# Patient Record
Sex: Female | Born: 1965
Health system: Southern US, Community
[De-identification: ages and names within clinical notes are randomized; demographics above are authoritative.]

## PROBLEM LIST (undated history)

## (undated) DIAGNOSIS — D649 Anemia, unspecified: Secondary | ICD-10-CM

## (undated) DIAGNOSIS — K219 Gastro-esophageal reflux disease without esophagitis: Secondary | ICD-10-CM

## (undated) DIAGNOSIS — R928 Other abnormal and inconclusive findings on diagnostic imaging of breast: Secondary | ICD-10-CM

## (undated) DIAGNOSIS — F419 Anxiety disorder, unspecified: Secondary | ICD-10-CM

## (undated) DIAGNOSIS — E663 Overweight: Secondary | ICD-10-CM

## (undated) DIAGNOSIS — J381 Polyp of vocal cord and larynx: Secondary | ICD-10-CM

## (undated) DIAGNOSIS — M542 Cervicalgia: Secondary | ICD-10-CM

## (undated) DIAGNOSIS — Z8489 Family history of other specified conditions: Secondary | ICD-10-CM

## (undated) DIAGNOSIS — R87619 Unspecified abnormal cytological findings in specimens from cervix uteri: Secondary | ICD-10-CM

## (undated) DIAGNOSIS — IMO0002 Reserved for concepts with insufficient information to code with codable children: Secondary | ICD-10-CM

## (undated) HISTORY — DX: Reserved for concepts with insufficient information to code with codable children: IMO0002

## (undated) HISTORY — DX: Overweight: E66.3

## (undated) HISTORY — DX: Unspecified abnormal cytological findings in specimens from cervix uteri: R87.619

## (undated) HISTORY — DX: Anemia, unspecified: D64.9

## (undated) HISTORY — DX: Polyp of vocal cord and larynx: J38.1

## (undated) HISTORY — DX: Anxiety disorder, unspecified: F41.9

## (undated) HISTORY — DX: Other abnormal and inconclusive findings on diagnostic imaging of breast: R92.8

## (undated) HISTORY — DX: Cervicalgia: M54.2

## (undated) HISTORY — PX: COLONOSCOPY: SHX174

## (undated) HISTORY — PX: ABDOMINAL HYSTERECTOMY: SHX81

## (undated) HISTORY — DX: Gastro-esophageal reflux disease without esophagitis: K21.9

---

## 1997-12-22 ENCOUNTER — Encounter: Admission: RE | Admit: 1997-12-22 | Discharge: 1998-03-22 | Payer: Self-pay

## 1999-03-17 ENCOUNTER — Inpatient Hospital Stay (HOSPITAL_COMMUNITY): Admission: AD | Admit: 1999-03-17 | Discharge: 1999-03-20 | Payer: Self-pay | Admitting: Obstetrics and Gynecology

## 1999-03-21 ENCOUNTER — Encounter (HOSPITAL_COMMUNITY): Admission: RE | Admit: 1999-03-21 | Discharge: 1999-06-19 | Payer: Self-pay | Admitting: Obstetrics and Gynecology

## 1999-05-05 ENCOUNTER — Other Ambulatory Visit: Admission: RE | Admit: 1999-05-05 | Discharge: 1999-05-05 | Payer: Self-pay | Admitting: Obstetrics and Gynecology

## 2000-05-21 ENCOUNTER — Other Ambulatory Visit: Admission: RE | Admit: 2000-05-21 | Discharge: 2000-05-21 | Payer: Self-pay | Admitting: Obstetrics and Gynecology

## 2000-11-12 ENCOUNTER — Encounter: Admission: RE | Admit: 2000-11-12 | Discharge: 2000-11-12 | Payer: Self-pay | Admitting: Infectious Diseases

## 2000-11-18 ENCOUNTER — Encounter: Payer: Self-pay | Admitting: Infectious Diseases

## 2000-11-18 ENCOUNTER — Ambulatory Visit (HOSPITAL_COMMUNITY): Admission: RE | Admit: 2000-11-18 | Discharge: 2000-11-18 | Payer: Self-pay | Admitting: Infectious Diseases

## 2000-11-20 ENCOUNTER — Encounter: Admission: RE | Admit: 2000-11-20 | Discharge: 2000-11-20 | Payer: Self-pay | Admitting: Infectious Diseases

## 2001-01-17 ENCOUNTER — Encounter: Admission: RE | Admit: 2001-01-17 | Discharge: 2001-01-17 | Payer: Self-pay | Admitting: Infectious Diseases

## 2001-01-17 ENCOUNTER — Encounter: Payer: Self-pay | Admitting: Infectious Diseases

## 2001-01-17 ENCOUNTER — Ambulatory Visit (HOSPITAL_COMMUNITY): Admission: RE | Admit: 2001-01-17 | Discharge: 2001-01-17 | Payer: Self-pay | Admitting: Infectious Diseases

## 2001-04-17 ENCOUNTER — Encounter: Payer: Self-pay | Admitting: Infectious Diseases

## 2001-04-17 ENCOUNTER — Ambulatory Visit (HOSPITAL_COMMUNITY): Admission: RE | Admit: 2001-04-17 | Discharge: 2001-04-17 | Payer: Self-pay | Admitting: Infectious Diseases

## 2001-06-02 ENCOUNTER — Other Ambulatory Visit: Admission: RE | Admit: 2001-06-02 | Discharge: 2001-06-02 | Payer: Self-pay | Admitting: Obstetrics and Gynecology

## 2002-07-21 ENCOUNTER — Other Ambulatory Visit: Admission: RE | Admit: 2002-07-21 | Discharge: 2002-07-21 | Payer: Self-pay | Admitting: Obstetrics and Gynecology

## 2003-10-28 ENCOUNTER — Emergency Department (HOSPITAL_COMMUNITY): Admission: AD | Admit: 2003-10-28 | Discharge: 2003-10-28 | Payer: Self-pay | Admitting: Family Medicine

## 2003-11-08 ENCOUNTER — Encounter: Admission: RE | Admit: 2003-11-08 | Discharge: 2003-11-08 | Payer: Self-pay | Admitting: Family Medicine

## 2004-01-11 ENCOUNTER — Other Ambulatory Visit: Admission: RE | Admit: 2004-01-11 | Discharge: 2004-01-11 | Payer: Self-pay | Admitting: Obstetrics and Gynecology

## 2005-03-23 ENCOUNTER — Other Ambulatory Visit: Admission: RE | Admit: 2005-03-23 | Discharge: 2005-03-23 | Payer: Self-pay | Admitting: Obstetrics and Gynecology

## 2006-10-10 ENCOUNTER — Ambulatory Visit: Payer: Self-pay | Admitting: Vascular Surgery

## 2006-10-17 ENCOUNTER — Ambulatory Visit: Payer: Self-pay | Admitting: *Deleted

## 2006-10-21 ENCOUNTER — Ambulatory Visit: Payer: Self-pay | Admitting: *Deleted

## 2006-12-12 ENCOUNTER — Ambulatory Visit: Payer: Self-pay | Admitting: Vascular Surgery

## 2007-12-22 ENCOUNTER — Emergency Department (HOSPITAL_COMMUNITY): Admission: EM | Admit: 2007-12-22 | Discharge: 2007-12-22 | Payer: Self-pay | Admitting: Family Medicine

## 2008-07-01 ENCOUNTER — Encounter: Admission: RE | Admit: 2008-07-01 | Discharge: 2008-07-01 | Payer: Self-pay | Admitting: Obstetrics and Gynecology

## 2009-01-04 ENCOUNTER — Ambulatory Visit: Payer: Self-pay | Admitting: Psychology

## 2009-01-18 ENCOUNTER — Ambulatory Visit: Payer: Self-pay | Admitting: Psychology

## 2009-02-03 ENCOUNTER — Ambulatory Visit: Payer: Self-pay | Admitting: Psychology

## 2009-02-11 ENCOUNTER — Ambulatory Visit: Payer: Self-pay | Admitting: Psychology

## 2009-02-24 ENCOUNTER — Ambulatory Visit: Payer: Self-pay | Admitting: Psychology

## 2009-03-08 ENCOUNTER — Ambulatory Visit: Payer: Self-pay | Admitting: Psychology

## 2009-03-15 ENCOUNTER — Ambulatory Visit: Payer: Self-pay | Admitting: Psychology

## 2009-04-05 ENCOUNTER — Ambulatory Visit: Payer: Self-pay | Admitting: Psychology

## 2009-05-10 ENCOUNTER — Ambulatory Visit: Payer: Self-pay | Admitting: Psychology

## 2009-07-04 ENCOUNTER — Encounter: Admission: RE | Admit: 2009-07-04 | Discharge: 2009-07-04 | Payer: Self-pay | Admitting: Obstetrics and Gynecology

## 2009-07-11 ENCOUNTER — Ambulatory Visit: Payer: Self-pay | Admitting: Psychology

## 2009-07-26 ENCOUNTER — Ambulatory Visit: Payer: Self-pay | Admitting: Psychology

## 2009-08-05 ENCOUNTER — Ambulatory Visit: Payer: Self-pay | Admitting: Psychology

## 2009-08-12 ENCOUNTER — Ambulatory Visit: Payer: Self-pay | Admitting: Psychology

## 2009-08-18 ENCOUNTER — Ambulatory Visit: Payer: Self-pay | Admitting: Psychology

## 2009-08-23 ENCOUNTER — Ambulatory Visit: Payer: Self-pay | Admitting: Psychology

## 2009-12-06 ENCOUNTER — Ambulatory Visit: Payer: Self-pay | Admitting: Psychology

## 2010-09-17 ENCOUNTER — Encounter: Payer: Self-pay | Admitting: Obstetrics and Gynecology

## 2010-09-18 ENCOUNTER — Encounter
Admission: RE | Admit: 2010-09-18 | Discharge: 2010-09-18 | Payer: Self-pay | Source: Home / Self Care | Attending: Obstetrics and Gynecology | Admitting: Obstetrics and Gynecology

## 2010-09-18 ENCOUNTER — Encounter: Payer: Self-pay | Admitting: Obstetrics and Gynecology

## 2010-11-11 ENCOUNTER — Emergency Department (HOSPITAL_BASED_OUTPATIENT_CLINIC_OR_DEPARTMENT_OTHER)
Admission: EM | Admit: 2010-11-11 | Discharge: 2010-11-11 | Disposition: A | Discharge status: Home / Self Care | Payer: 59 | Attending: Emergency Medicine | Admitting: Emergency Medicine

## 2010-11-11 ENCOUNTER — Emergency Department (INDEPENDENT_AMBULATORY_CARE_PROVIDER_SITE_OTHER): Payer: 59

## 2010-11-11 DIAGNOSIS — R0789 Other chest pain: Secondary | ICD-10-CM | POA: Insufficient documentation

## 2010-11-11 DIAGNOSIS — R079 Chest pain, unspecified: Secondary | ICD-10-CM

## 2010-11-11 DIAGNOSIS — F411 Generalized anxiety disorder: Secondary | ICD-10-CM | POA: Insufficient documentation

## 2010-11-11 LAB — BASIC METABOLIC PANEL
BUN: 12 mg/dL (ref 6–23)
CO2: 28 mEq/L (ref 19–32)
Calcium: 9.3 mg/dL (ref 8.4–10.5)
Sodium: 144 mEq/L (ref 135–145)

## 2010-11-11 LAB — DIFFERENTIAL
Basophils Relative: 1 % (ref 0–1)
Eosinophils Absolute: 0.1 10*3/uL (ref 0.0–0.7)
Eosinophils Relative: 1 % (ref 0–5)
Lymphocytes Relative: 14 % (ref 12–46)
Neutro Abs: 6.3 10*3/uL (ref 1.7–7.7)

## 2010-11-11 LAB — POCT CARDIAC MARKERS
CKMB, poc: <1 ng/mL — ABNORMAL LOW (ref 1.0–8.0)
Troponin i, poc: <0.05 ng/mL (ref 0.00–0.09)

## 2010-11-11 LAB — CBC
MCH: 30.1 pg (ref 26.0–34.0)
MCHC: 34.1 g/dL (ref 30.0–36.0)
RBC: 4.05 MIL/uL (ref 3.87–5.11)

## 2011-11-06 ENCOUNTER — Ambulatory Visit (INDEPENDENT_AMBULATORY_CARE_PROVIDER_SITE_OTHER): Payer: 59 | Admitting: Internal Medicine

## 2011-11-06 ENCOUNTER — Encounter: Payer: Self-pay | Admitting: Internal Medicine

## 2011-11-06 VITALS — BP 118/76 | HR 68 | Temp 99.2°F | Ht 67.0 in | Wt 169.5 lb

## 2011-11-06 DIAGNOSIS — R6889 Other general symptoms and signs: Secondary | ICD-10-CM

## 2011-11-06 DIAGNOSIS — D649 Anemia, unspecified: Secondary | ICD-10-CM | POA: Insufficient documentation

## 2011-11-06 DIAGNOSIS — Z8669 Personal history of other diseases of the nervous system and sense organs: Secondary | ICD-10-CM | POA: Insufficient documentation

## 2011-11-06 DIAGNOSIS — F419 Anxiety disorder, unspecified: Secondary | ICD-10-CM | POA: Insufficient documentation

## 2011-11-06 DIAGNOSIS — IMO0002 Reserved for concepts with insufficient information to code with codable children: Secondary | ICD-10-CM

## 2011-11-06 DIAGNOSIS — M542 Cervicalgia: Secondary | ICD-10-CM | POA: Insufficient documentation

## 2011-11-06 DIAGNOSIS — R87619 Unspecified abnormal cytological findings in specimens from cervix uteri: Secondary | ICD-10-CM | POA: Insufficient documentation

## 2011-11-06 DIAGNOSIS — Z139 Encounter for screening, unspecified: Secondary | ICD-10-CM

## 2011-11-06 DIAGNOSIS — F411 Generalized anxiety disorder: Secondary | ICD-10-CM | POA: Insufficient documentation

## 2011-11-06 DIAGNOSIS — Z0000 Encounter for general adult medical examination without abnormal findings: Secondary | ICD-10-CM

## 2011-11-06 HISTORY — DX: Personal history of other diseases of the nervous system and sense organs: Z86.69

## 2011-11-06 LAB — COMPREHENSIVE METABOLIC PANEL
AST: 26 U/L (ref 0–37)
Albumin: 4.4 g/dL (ref 3.5–5.2)
BUN: 17 mg/dL (ref 6–23)
CO2: 26 mEq/L (ref 19–32)
Glucose, Bld: 80 mg/dL (ref 70–99)
Potassium: 4.5 mEq/L (ref 3.5–5.3)
Sodium: 139 mEq/L (ref 135–145)

## 2011-11-06 LAB — LIPID PANEL
Cholesterol: 204 mg/dL — ABNORMAL HIGH (ref 0–200)
HDL: 68 mg/dL (ref >39)
LDL Cholesterol: 118 mg/dL — ABNORMAL HIGH (ref 0–99)
Total CHOL/HDL Ratio: 3 Ratio
Triglycerides: 88 mg/dL (ref <150)

## 2011-11-06 LAB — POCT URINALYSIS DIP (MANUAL ENTRY)
Blood, UA: NEGATIVE
Glucose, UA: NEGATIVE
Nitrite, UA: NEGATIVE
Protein Ur, POC: NEGATIVE

## 2011-11-06 NOTE — Patient Instructions (Signed)
Keep appt with GYN  Labs wil be mailed to you

## 2011-11-06 NOTE — Progress Notes (Signed)
Subjective:    Patient ID: Karina Barnett, female    DOB: [birth year], 46 y.o.   MRN: [id number]  HPI Karina Barnett is a new pt here for CPE.  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 She is getting married this summer.  Former care at [employer].  PMH of Fe deficiency anemia, anxiety post divorce,  Remote abnormal pap S/P cryosurgery, and abnormal mammogram in 2010.  Karina Barnett also describes remote history of FUO with Bell's palsy felt related to bacterial pneumonia.  Fever resolved with antibiotics and she has no residual from Bell;s palsy  Overall doing well.  She has occasional painful calf pain at night.  No edema to LE  She has not used and anxiety med in quite some time and is looking forward to her upcoming seocnd marriage.   She is exercising with 21 minute workout and is losing weight.  She is watching her diet very well  Allergies  Allergen Reactions  . Flexeril (Cyclobenzaprine Hcl) Other (See Comments)    Causes severe joint pain  . Penicillins Rash   Past Medical History  Diagnosis Date  . Abnormal mammogram   . Abnormal Pap smear   . Cervical spine pain     C5-C6 compression  . Anemia   . Anxiety    History reviewed. No pertinent past surgical history. History   Social History  . Marital Status: Married    Spouse Name: N/A    Number of Children: N/A  . Years of Education: N/A   Occupational History  . Not on file.   Social History Main Topics  . Smoking status: Former Games developer  . Smokeless tobacco: Never Used  . Alcohol Use: Yes     social, occasional  . Drug Use: No  . Sexually Active: Yes    Birth Control/ Protection: IUD   Other Topics Concern  . Not on file   Social History Narrative  . No narrative on file   Family History  Problem Relation Age of Onset  . Hypertension Mother   . Irritable bowel syndrome Mother   . Arthritis Maternal Grandmother   . Hypertension Maternal Grandfather   . Heart disease Maternal Grandfather   . Alzheimer's disease Maternal  Grandmother   . Alzheimer's disease Paternal Grandmother    Patient Active Problem List  Diagnoses  . Anemia  . Anxiety  . Abnormal Pap smear  . Cervical spine pain  . H/O Bell's palsy   Current Outpatient Prescriptions on File Prior to Visit  Medication Sig Dispense Refill  . levonorgestrel (MIRENA) 20 MCG/24HR IUD 1 each by Intrauterine route once. 2009            Review of Systems See HPI    Objective:   Physical Exam Physical Exam  Nursing note and vitals reviewed.  Constitutional: She is oriented to person, place, and time. She appears well-developed and well-nourished.  HENT:  Head: Normocephalic and atraumatic.  Right Ear: Tympanic membrane and ear canal normal. No drainage. Tympanic membrane is not injected and not erythematous.  Left Ear: Tympanic membrane and ear canal normal. No drainage. Tympanic membrane is not injected and not erythematous.  Nose: Nose normal. Right sinus exhibits no maxillary sinus tenderness and no frontal sinus tenderness. Left sinus exhibits no maxillary sinus tenderness and no frontal sinus tenderness.  Mouth/Throat: Oropharynx is clear and moist. No oral lesions. No oropharyngeal exudate.  Eyes: Conjunctivae and EOM are normal. Pupils are equal, round, and reactive to light.  Neck: Normal  range of motion. Neck supple. No JVD present. Carotid bruit is not present. No mass and no thyromegaly present.  Cardiovascular: Normal rate, regular rhythm, S1 normal, S2 normal and intact distal pulses. Exam reveals no gallop and no friction rub.  No murmur heard.  Pulses:  Carotid pulses are 2+ on the right side, and 2+ on the left side.  Dorsalis pedis pulses are 2+ on the right side, and 2+ on the left side.  No carotid bruit. No LE edema  Pulmonary/Chest: Breath sounds normal. She has no wheezes. She has no rales. She exhibits no tenderness. Breasts no discrete masses no nipple discharge no axillary addenopathy bilaterally Abdominal: Soft. Bowel  sounds are normal. She exhibits no distension and no mass. There is no hepatosplenomegaly. There is no tenderness. There is no CVA tenderness.  Musculoskeletal: Normal range of motion.  No active synovitis to joints.  Lymphadenopathy:  She has no cervical adenopathy.  She has no axillary adenopathy.  Right: No inguinal and no supraclavicular adenopathy present.  Left: No inguinal and no supraclavicular adenopathy present.  Neurological: She is alert and oriented to person, place, and time. She has normal strength and normal reflexes. She displays no tremor. No cranial nerve deficit or sensory deficit. Coordination and gait normal.  Skin: Skin is warm and dry. No rash noted. No cyanosis. Nails show no clubbing.  Psychiatric: She has a normal mood and affect. Her speech is normal and behavior is normal. Cognition and memory are normal.           Assessment & Plan:  1)  Health Maintneannce   She is to check when she received TDAP from [facility].  See scanned sheet  She has pap scheduled with Dr. Burney Gauze in a few weeks 2)  Anemia   Will check today 3)  H/o abnomral mammogram   Most recent mamamogram no worrsome findings.  She is due in April for yearly screening 4)  H/O Bell's palsy no residual on exam. 5)  Calf discomfort.   Will moniter. Really does not fit with RLS

## 2011-11-07 LAB — CBC WITH DIFFERENTIAL/PLATELET
Basophils Absolute: 0 10*3/uL (ref 0.0–0.1)
Basophils Relative: 1 % (ref 0–1)
Eosinophils Relative: 2 % (ref 0–5)
HCT: 38.8 % (ref 36.0–46.0)
Lymphocytes Relative: 28 % (ref 12–46)
MCHC: 32 g/dL (ref 30.0–36.0)
Platelets: 263 10*3/uL (ref 150–400)
RBC: 4.27 MIL/uL (ref 3.87–5.11)
RDW: 12.9 % (ref 11.5–15.5)

## 2011-11-07 LAB — TSH: TSH: 1.083 u[IU]/mL (ref 0.350–4.500)

## 2011-11-07 LAB — POCT URINALYSIS DIPSTICK
Nitrite, UA: NEGATIVE
Spec Grav, UA: 1.02
Urobilinogen, UA: 0.2

## 2011-11-08 ENCOUNTER — Telehealth: Payer: Self-pay | Admitting: Emergency Medicine

## 2011-11-08 NOTE — Telephone Encounter (Signed)
Lab results mailed to pt's home address per DDS

## 2011-11-14 ENCOUNTER — Telehealth: Payer: Self-pay | Admitting: Emergency Medicine

## 2011-11-14 MED ORDER — AZITHROMYCIN 250 MG PO TABS: ORAL_TABLET | ORAL | Status: AC

## 2011-11-14 NOTE — Telephone Encounter (Signed)
Karina Barnett called this morning stating she continues to have head and sinus congestion.  She states symptoms started 10 days ago, she was seen 3/12 and was told if her symptoms did not improve to call back.  She is no longer running a fever, but has lots of head and nasal congestion, sore throat from drainage, slight cough, runny nose with yellowish mucous.  She states that she is taking OTC sinus medication and will continue that therapy if that is what DDS feels is best.  She just feels run down and wanted to see if DDS felt like she needed an antibiotic since it had been going on so long.  Requests any medication be sent to [facility]

## 2011-11-14 NOTE — Telephone Encounter (Signed)
Let Karina Barnett know that I sent over a Z-pak

## 2011-11-14 NOTE — Telephone Encounter (Signed)
Spoke with Drinda Butts, she is aware Zpak called in, will call if no improvement in symptoms after completion of antibiotics

## 2011-11-26 ENCOUNTER — Other Ambulatory Visit (HOSPITAL_COMMUNITY): Payer: Self-pay | Admitting: Obstetrics and Gynecology

## 2011-11-26 DIAGNOSIS — Z1231 Encounter for screening mammogram for malignant neoplasm of breast: Secondary | ICD-10-CM

## 2011-11-27 ENCOUNTER — Ambulatory Visit (HOSPITAL_COMMUNITY)
Admission: RE | Admit: 2011-11-27 | Discharge: 2011-11-27 | Disposition: A | Discharge status: Home / Self Care | Payer: 59 | Source: Ambulatory Visit | Attending: Obstetrics and Gynecology | Admitting: Obstetrics and Gynecology

## 2011-11-27 DIAGNOSIS — Z1231 Encounter for screening mammogram for malignant neoplasm of breast: Secondary | ICD-10-CM | POA: Insufficient documentation

## 2012-09-08 ENCOUNTER — Ambulatory Visit: Payer: 59 | Admitting: Internal Medicine

## 2012-09-08 DIAGNOSIS — Z1389 Encounter for screening for other disorder: Secondary | ICD-10-CM

## 2012-10-11 ENCOUNTER — Other Ambulatory Visit: Payer: Self-pay

## 2012-11-18 ENCOUNTER — Ambulatory Visit (INDEPENDENT_AMBULATORY_CARE_PROVIDER_SITE_OTHER): Payer: 59 | Admitting: Internal Medicine

## 2012-11-18 ENCOUNTER — Encounter: Payer: Self-pay | Admitting: Internal Medicine

## 2012-11-18 VITALS — BP 98/61 | HR 64 | Temp 97.3°F | Resp 16 | Ht 67.0 in | Wt 169.0 lb

## 2012-11-18 DIAGNOSIS — Z139 Encounter for screening, unspecified: Secondary | ICD-10-CM

## 2012-11-18 DIAGNOSIS — I499 Cardiac arrhythmia, unspecified: Secondary | ICD-10-CM

## 2012-11-18 DIAGNOSIS — Z8669 Personal history of other diseases of the nervous system and sense organs: Secondary | ICD-10-CM

## 2012-11-18 DIAGNOSIS — H109 Unspecified conjunctivitis: Secondary | ICD-10-CM

## 2012-11-18 DIAGNOSIS — E785 Hyperlipidemia, unspecified: Secondary | ICD-10-CM | POA: Insufficient documentation

## 2012-11-18 DIAGNOSIS — I495 Sick sinus syndrome: Secondary | ICD-10-CM

## 2012-11-18 DIAGNOSIS — Z0000 Encounter for general adult medical examination without abnormal findings: Secondary | ICD-10-CM

## 2012-11-18 HISTORY — DX: Cardiac arrhythmia, unspecified: I49.9

## 2012-11-18 HISTORY — DX: Unspecified conjunctivitis: H10.9

## 2012-11-18 LAB — COMPREHENSIVE METABOLIC PANEL
ALT: 15 U/L (ref 0–35)
Sodium: 138 mEq/L (ref 135–145)
Total Bilirubin: 0.6 mg/dL (ref 0.3–1.2)

## 2012-11-18 LAB — CBC WITH DIFFERENTIAL/PLATELET
Basophils Relative: 1 % (ref 0–1)
Eosinophils Absolute: 0.1 10*3/uL (ref 0.0–0.7)
Eosinophils Relative: 2 % (ref 0–5)
HCT: 38.8 % (ref 36.0–46.0)
Hemoglobin: 13.1 g/dL (ref 12.0–15.0)
Lymphocytes Relative: 27 % (ref 12–46)
MCHC: 33.8 g/dL (ref 30.0–36.0)
MCV: 86.6 fL (ref 78.0–100.0)
Neutro Abs: 3 10*3/uL (ref 1.7–7.7)
Platelets: 281 10*3/uL (ref 150–400)
RBC: 4.48 MIL/uL (ref 3.87–5.11)
RDW: 13.4 % (ref 11.5–15.5)
WBC: 4.8 10*3/uL (ref 4.0–10.5)

## 2012-11-18 LAB — POCT URINALYSIS DIPSTICK
Glucose, UA: NEGATIVE
Ketones, UA: NEGATIVE
Urobilinogen, UA: NEGATIVE

## 2012-11-18 LAB — LIPID PANEL
Cholesterol: 222 mg/dL — ABNORMAL HIGH (ref 0–200)
HDL: 74 mg/dL (ref >39)

## 2012-11-18 MED ORDER — CIPROFLOXACIN HCL 0.3 % OP SOLN: OPHTHALMIC | Status: DC

## 2012-11-18 MED ORDER — ACYCLOVIR 200 MG PO CAPS: 200.0000 mg | ORAL_CAPSULE | Freq: Two times a day (BID) | ORAL | Status: DC

## 2012-11-18 MED ORDER — ALPRAZOLAM 0.25 MG PO TABS: 0.2500 mg | ORAL_TABLET | Freq: Every evening | ORAL | Status: DC | PRN
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
## 2012-11-18 NOTE — Patient Instructions (Addendum)
Be sure to make appt with eye MD  Use eye drops to Right eye as directed  Check about your Tetanus vaccine  .  Call if you need a 10 year booster

## 2012-11-19 LAB — VITAMIN D 25 HYDROXY (VIT D DEFICIENCY, FRACTURES): Vit D, 25-Hydroxy: 23 ng/mL — ABNORMAL LOW (ref 30–89)

## 2012-11-20 ENCOUNTER — Encounter: Payer: Self-pay | Admitting: Internal Medicine

## 2012-11-20 ENCOUNTER — Encounter: Payer: Self-pay | Admitting: *Deleted

## 2012-12-25 ENCOUNTER — Ambulatory Visit: Payer: 59

## 2013-01-01 ENCOUNTER — Ambulatory Visit
Admission: RE | Admit: 2013-01-01 | Discharge: 2013-01-01 | Disposition: A | Discharge status: Home / Self Care | Payer: 59 | Source: Ambulatory Visit | Attending: Internal Medicine | Admitting: Internal Medicine

## 2013-01-01 DIAGNOSIS — Z139 Encounter for screening, unspecified: Secondary | ICD-10-CM

## 2013-01-08 ENCOUNTER — Telehealth: Payer: Self-pay | Admitting: *Deleted

## 2013-01-08 NOTE — Telephone Encounter (Signed)
Returned pt call regarding appt for daughters LVM awaiting return call

## 2013-05-14 ENCOUNTER — Encounter: Payer: Self-pay | Admitting: *Deleted

## 2013-05-14 ENCOUNTER — Encounter: Payer: 59 | Attending: Internal Medicine | Admitting: *Deleted

## 2013-05-14 VITALS — Ht 65.5 in | Wt 171.5 lb

## 2013-05-14 DIAGNOSIS — Z713 Dietary counseling and surveillance: Secondary | ICD-10-CM | POA: Insufficient documentation

## 2013-05-14 DIAGNOSIS — E663 Overweight: Secondary | ICD-10-CM | POA: Insufficient documentation

## 2013-05-18 ENCOUNTER — Encounter: Payer: Self-pay | Admitting: *Deleted

## 2013-05-18 NOTE — Progress Notes (Signed)
  Medical Nutrition Therapy:  Appt start time: 1145   End time:  1245.  Assessment:  Overweight/Nutrition Counseling.   MEDICATIONS: See medication list   DIETARY INTAKE:  Usual eating pattern includes 3 meals and 0-2 snacks per day.  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   Nutritional Diagnosis:  [place]-3.3 Overweight related to history of poor food choices and lack of exercise as evidenced by BMI of 28.1 kg/m^2 and busy lifestyle.    Intervention:  Nutrition education.  Monitoring/Evaluation:  Dietary intake, exercise, and body weight prn.

## 2013-07-02 ENCOUNTER — Other Ambulatory Visit: Payer: Self-pay

## 2013-08-17 ENCOUNTER — Ambulatory Visit (INDEPENDENT_AMBULATORY_CARE_PROVIDER_SITE_OTHER): Payer: 59 | Admitting: Internal Medicine

## 2013-08-17 ENCOUNTER — Encounter: Payer: Self-pay | Admitting: Internal Medicine

## 2013-08-17 VITALS — BP 102/63 | HR 71 | Temp 98.2°F | Resp 18

## 2013-08-17 DIAGNOSIS — F439 Reaction to severe stress, unspecified: Secondary | ICD-10-CM

## 2013-08-17 DIAGNOSIS — Z733 Stress, not elsewhere classified: Secondary | ICD-10-CM

## 2013-08-17 DIAGNOSIS — J209 Acute bronchitis, unspecified: Secondary | ICD-10-CM

## 2013-08-17 DIAGNOSIS — H109 Unspecified conjunctivitis: Secondary | ICD-10-CM

## 2013-08-17 DIAGNOSIS — R05 Cough: Secondary | ICD-10-CM

## 2013-08-17 DIAGNOSIS — R059 Cough, unspecified: Secondary | ICD-10-CM

## 2013-08-17 MED ORDER — OLOPATADINE HCL 0.2 % OP SOLN: OPHTHALMIC | Status: DC

## 2013-08-17 MED ORDER — HYDROCOD POLST-CHLORPHEN POLST 10-8 MG/5ML PO LQCR: 5.0000 mL | Freq: Two times a day (BID) | ORAL | Status: DC | PRN

## 2013-08-17 MED ORDER — AZITHROMYCIN 250 MG PO TABS: ORAL_TABLET | ORAL | Status: DC

## 2013-08-17 MED ORDER — CIPROFLOXACIN HCL 0.3 % OP OINT: TOPICAL_OINTMENT | OPHTHALMIC | Status: DC
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
## 2013-10-21 ENCOUNTER — Ambulatory Visit (HOSPITAL_COMMUNITY)
Admission: RE | Admit: 2013-10-21 | Discharge: 2013-10-21 | Disposition: A | Discharge status: Home / Self Care | Payer: 59 | Source: Ambulatory Visit | Attending: Internal Medicine | Admitting: Internal Medicine

## 2013-10-21 DIAGNOSIS — I499 Cardiac arrhythmia, unspecified: Secondary | ICD-10-CM | POA: Insufficient documentation

## 2013-10-21 LAB — COMPREHENSIVE METABOLIC PANEL
ALT: 21 U/L (ref 0–35)
AST: 22 U/L (ref 0–37)
Albumin: 4.2 g/dL (ref 3.5–5.2)
Alkaline Phosphatase: 76 U/L (ref 39–117)
BILIRUBIN TOTAL: 0.4 mg/dL (ref 0.3–1.2)
BUN: 26 mg/dL — AB (ref 6–23)
CO2: 27 mEq/L (ref 19–32)
Calcium: 10.3 mg/dL (ref 8.4–10.5)
Chloride: 101 mEq/L (ref 96–112)
Creatinine, Ser: 0.8 mg/dL (ref 0.50–1.10)
GFR calc Af Amer: >90 mL/min (ref >90)
GFR calc non Af Amer: 86 mL/min — ABNORMAL LOW (ref >90)
GLUCOSE: 103 mg/dL — AB (ref 70–99)
POTASSIUM: 4.7 meq/L (ref 3.7–5.3)
Sodium: 141 mEq/L (ref 137–147)
Total Protein: 7.6 g/dL (ref 6.0–8.3)

## 2013-10-21 LAB — TSH: TSH: 1.511 u[IU]/mL (ref 0.350–4.500)

## 2013-12-30 ENCOUNTER — Ambulatory Visit (INDEPENDENT_AMBULATORY_CARE_PROVIDER_SITE_OTHER): Payer: 59 | Admitting: Internal Medicine

## 2013-12-30 ENCOUNTER — Encounter: Payer: Self-pay | Admitting: Internal Medicine

## 2013-12-30 VITALS — BP 117/66 | HR 66 | Temp 98.3°F | Resp 18 | Wt 172.0 lb

## 2013-12-30 DIAGNOSIS — J029 Acute pharyngitis, unspecified: Secondary | ICD-10-CM

## 2013-12-30 DIAGNOSIS — H6690 Otitis media, unspecified, unspecified ear: Secondary | ICD-10-CM

## 2013-12-30 HISTORY — DX: Acute pharyngitis, unspecified: J02.9

## 2013-12-30 MED ORDER — AZITHROMYCIN 250 MG PO TABS: ORAL_TABLET | ORAL | Status: DC

## 2013-12-30 NOTE — Progress Notes (Signed)
Subjective:    Patient ID: Karina Barnett, female    DOB: July 01, 1966, 48 y.o.   MRN: [id number]  HPI Karina Barnett is here with acute visit  Several days of sore throat no fever no ear pain no cough  Allergies  Allergen Reactions  . Flexeril [Cyclobenzaprine Hcl] Other (See Comments)    Causes severe joint pain  . Penicillins Rash   Past Medical History  Diagnosis Date  . Abnormal mammogram   . Abnormal Pap smear   . Cervical spine pain     C5-C6 compression  . Anemia   . Anxiety   . Overweight (BMI 25.0-29.9)    History reviewed. No pertinent past surgical history. History   Social History  . Marital Status: Married    Spouse Name: N/A    Number of Children: N/A  . Years of Education: N/A   Occupational History  . Not on file.   Social History Main Topics  . Smoking status: Never Smoker   . Smokeless tobacco: Never Used  . Alcohol Use: Yes     Comment: social, occasional  . Drug Use: No  . Sexual Activity: Yes    Birth Control/ Protection: IUD   Other Topics Concern  . Not on file   Social History Narrative  . No narrative on file   Family History  Problem Relation Age of Onset  . Hypertension Mother   . Irritable bowel syndrome Mother   . Arthritis Maternal Grandmother   . Hypertension Maternal Grandfather   . Heart disease Maternal Grandfather   . Alzheimer's disease Maternal Grandmother   . Alzheimer's disease Paternal Grandmother    Patient Active Problem List   Diagnosis Date Noted  . Other and unspecified hyperlipidemia 11/18/2012  . Conjunctivitis unspecified 11/18/2012  . Arrhythmia, sinus node 11/18/2012  . H/O Bell's palsy 11/06/2011  . Anemia   . Anxiety   . Abnormal Pap smear   . Cervical spine pain    Current Outpatient Prescriptions on File Prior to Visit  Medication Sig Dispense Refill  . ALPRAZolam (XANAX) 0.25 MG tablet Take 1 tablet (0.25 mg total) by mouth at bedtime as needed.  30 tablet  1  . levonorgestrel (MIRENA) 20  MCG/24HR IUD 1 each by Intrauterine route once. 2009      . Multiple Vitamins-Calcium (ONE-A-DAY WOMENS PO) Take 1 tablet by mouth.      . Olopatadine HCl (PATADAY) 0.2 % SOLN Apply 2 gtts to eye when done with  Ciloxan  2.5 Bottle  0  . acyclovir (ZOVIRAX) 200 MG capsule Take 200 mg by mouth 2 (two) times daily as needed.      Marland Kitchen ibuprofen (ADVIL,MOTRIN) 200 MG tablet Take 400 mg by mouth every 6 (six) hours as needed.       No current facility-administered medications on file prior to visit.       Review of Systems See HPI    Objective:   Physical Exam Physical Exam  Constitutional: She is oriented to person, place, and time. She appears well-developed and well-nourished. She is cooperative.  HENT:  Head: Normocephalic and atraumatic.  Right Ear: A middle ear effusion is present.  Left Ear: A middle ear effusion is present.  Nose: Mucosal edema present.  Mouth/Throat: Oropharyngeal exudate and posterior oropharyngeal erythema present.  Serous effusion bilaterally  Eyes: Conjunctivae and EOM are normal. Pupils are equal, round, and reactive to light.  Neck: Neck supple. Carotid bruit is not present. No mass present.  Cardiovascular:  Regular rhythm, normal heart sounds, intact distal pulses and normal pulses. Exam reveals no gallop and no friction rub.  No murmur heard.  Pulmonary/Chest: Breath sounds normal. She has no wheezes. She has no rhonchi. She has no rales.  Lymphadenopathy:  She has cervical adenopathy.  Neurological: She is alert and oriented to person, place, and time.  Skin: Skin is warm and dry. No abrasion, no bruising, no ecchymosis and no rash noted. No cyanosis. Nails show no clubbing.  Psychiatric: She has a normal mood and affect. Her speech is normal and behavior is normal.                Assessment & Plan:  Pharyngitis:  Z-pack    Serous OM    See e if no tbette

## 2014-02-24 ENCOUNTER — Other Ambulatory Visit: Payer: Self-pay

## 2014-02-24 DIAGNOSIS — Z1231 Encounter for screening mammogram for malignant neoplasm of breast: Secondary | ICD-10-CM

## 2014-03-05 ENCOUNTER — Ambulatory Visit
Admission: RE | Admit: 2014-03-05 | Discharge: 2014-03-05 | Disposition: A | Discharge status: Home / Self Care | Payer: 59 | Source: Ambulatory Visit

## 2014-03-05 DIAGNOSIS — Z1231 Encounter for screening mammogram for malignant neoplasm of breast: Secondary | ICD-10-CM

## 2014-04-12 ENCOUNTER — Encounter: Payer: Self-pay | Admitting: Internal Medicine

## 2014-04-12 ENCOUNTER — Other Ambulatory Visit: Payer: Self-pay | Admitting: *Deleted

## 2014-04-12 MED ORDER — ALPRAZOLAM 0.25 MG PO TABS: 0.2500 mg | ORAL_TABLET | Freq: Every evening | ORAL | Status: DC | PRN

## 2014-04-12 NOTE — Telephone Encounter (Signed)
Verbal called in & pt informed.

## 2014-04-12 NOTE — Telephone Encounter (Signed)
need to renew my prescription for Xanax .25mg . If you need to speak with me please call my cell phone [phone]. [facility]. I am having anxiety about my youngest daughter who is 49 years old and we found a lump in her right breast. I am trying to deal with this stress but I may need some assistance. thanks Karina Barnett  This request was sent to me by [facility]

## 2014-04-28 ENCOUNTER — Ambulatory Visit (INDEPENDENT_AMBULATORY_CARE_PROVIDER_SITE_OTHER): Payer: 59 | Admitting: Internal Medicine

## 2014-04-28 ENCOUNTER — Encounter: Payer: Self-pay | Admitting: Internal Medicine

## 2014-04-28 VITALS — BP 100/69 | HR 65 | Temp 98.2°F | Resp 16 | Wt 173.0 lb

## 2014-04-28 DIAGNOSIS — S8991XA Unspecified injury of right lower leg, initial encounter: Secondary | ICD-10-CM

## 2014-04-28 DIAGNOSIS — M25569 Pain in unspecified knee: Secondary | ICD-10-CM

## 2014-04-28 DIAGNOSIS — J029 Acute pharyngitis, unspecified: Secondary | ICD-10-CM

## 2014-04-28 DIAGNOSIS — R059 Cough, unspecified: Secondary | ICD-10-CM

## 2014-04-28 DIAGNOSIS — R05 Cough: Secondary | ICD-10-CM

## 2014-04-28 MED ORDER — AZITHROMYCIN 250 MG PO TABS: ORAL_TABLET | ORAL | Status: DC

## 2014-04-28 MED ORDER — HYDROCODONE-HOMATROPINE 5-1.5 MG/5ML PO SYRP: 5.0000 mL | ORAL_SOLUTION | Freq: Four times a day (QID) | ORAL | Status: DC | PRN

## 2014-04-28 NOTE — Patient Instructions (Signed)
To make appt with Dr. Oneida Alar   See me as needed

## 2014-04-28 NOTE — Progress Notes (Signed)
Subjective:    Patient ID: Karina Barnett, female    DOB: Mar 23, 1966, 48 y.o.   MRN: [id number]  HPI  Karina Barnett is here for acute visit for 2 issues.  Begain 6 days ago nasal stuffiness progressed to sore throat and hoarseness no fever no ear pain  Dry cough  No chest pain  Also began [employer] and had acute R knee pain  She has been wearing brace  Did not hear a popping sound   Allergies  Allergen Reactions  . Flexeril [Cyclobenzaprine Hcl] Other (See Comments)    Causes severe joint pain  . Penicillins Rash   Past Medical History  Diagnosis Date  . Abnormal mammogram   . Abnormal Pap smear   . Cervical spine pain     C5-C6 compression  . Anemia   . Anxiety   . Overweight (BMI 25.0-29.9)    No past surgical history on file. History   Social History  . Marital Status: Married    Spouse Name: N/A    Number of Children: N/A  . Years of Education: N/A   Occupational History  . Not on file.   Social History Main Topics  . Smoking status: Never Smoker   . Smokeless tobacco: Never Used  . Alcohol Use: Yes     Comment: social, occasional  . Drug Use: No  . Sexual Activity: Yes    Birth Control/ Protection: IUD   Other Topics Concern  . Not on file   Social History Narrative  . No narrative on file   Family History  Problem Relation Age of Onset  . Hypertension Mother   . Irritable bowel syndrome Mother   . Arthritis Maternal Grandmother   . Hypertension Maternal Grandfather   . Heart disease Maternal Grandfather   . Alzheimer's disease Maternal Grandmother   . Alzheimer's disease Paternal Grandmother    Patient Active Problem List   Diagnosis Date Noted  . Acute pharyngitis 12/30/2013  . Other and unspecified hyperlipidemia 11/18/2012  . Conjunctivitis unspecified 11/18/2012  . Arrhythmia, sinus node 11/18/2012  . H/O Bell's palsy 11/06/2011  . Anemia   . Anxiety   . Abnormal Pap smear   . Cervical spine pain    Current Outpatient  Prescriptions on File Prior to Visit  Medication Sig Dispense Refill  . acyclovir (ZOVIRAX) 200 MG capsule Take 200 mg by mouth 2 (two) times daily as needed.      . ALPRAZolam (XANAX) 0.25 MG tablet Take 1 tablet (0.25 mg total) by mouth at bedtime as needed.  30 tablet  1  . azithromycin (ZITHROMAX) 250 MG tablet Take as directed  6 each  0  . ibuprofen (ADVIL,MOTRIN) 200 MG tablet Take 400 mg by mouth every 6 (six) hours as needed.      Marland Kitchen levonorgestrel (MIRENA) 20 MCG/24HR IUD 1 each by Intrauterine route once. 2009      . Multiple Vitamins-Calcium (ONE-A-DAY WOMENS PO) Take 1 tablet by mouth.      . Olopatadine HCl (PATADAY) 0.2 % SOLN Apply 2 gtts to eye when done with  Ciloxan  2.5 Bottle  0   No current facility-administered medications on file prior to visit.      Review of Systems See HPI    Objective:   Physical Exam  Physical Exam  Constitutional: She is oriented to person, place, and time. She appears well-developed and well-nourished. She is cooperative.  HENT:  Head: Normocephalic and atraumatic.  Right Ear: A  middle ear effusion is present.  Left Ear: A middle ear effusion is present.  Nose: Mucosal edema present.  Mouth/Throat: Oropharyngeal exudate and posterior oropharyngeal erythema present.  Serous effusion bilaterally  Eyes: Conjunctivae and EOM are normal. Pupils are equal, round, and reactive to light.  Neck: Neck supple. Carotid bruit is not present. No mass present.  Cardiovascular: Regular rhythm, normal heart sounds, intact distal pulses and normal pulses. Exam reveals no gallop and no friction rub.  No murmur heard.  Pulmonary/Chest: Breath sounds normal. She has no wheezes. She has no rhonchi. She has no rales.  Lymphadenopathy:  She has cervical adenopathy.  Neurological: She is alert and oriented to person, place, and time.  Skin: Skin is warm and dry. No abrasion, no bruising, no ecchymosis and no rash noted. No cyanosis. Nails show no clubbing.    MS:  R knee minimal media effusion she does have slight ecchymosis medial aspect.   Ant drawer neg  Psychiatric: She has a normal mood and affect. Her speech is normal and behavior is normal.       Assessment & Plan:  Pharyngitis   Ok or Z-pak  Cough  Hycodan prn   Knee injury  :She does have slight bruising may have ligament strain.  Advised not to run.  She wishes to make her own appt with Dr. Oneida Alar

## 2014-05-11 ENCOUNTER — Other Ambulatory Visit: Payer: Self-pay | Admitting: *Deleted

## 2014-05-11 DIAGNOSIS — Z0000 Encounter for general adult medical examination without abnormal findings: Secondary | ICD-10-CM

## 2014-05-12 ENCOUNTER — Ambulatory Visit (INDEPENDENT_AMBULATORY_CARE_PROVIDER_SITE_OTHER): Payer: 59 | Admitting: Internal Medicine

## 2014-05-12 ENCOUNTER — Encounter: Payer: Self-pay | Admitting: Internal Medicine

## 2014-05-12 VITALS — BP 104/62 | HR 71 | Temp 97.9°F | Resp 16 | Ht 65.5 in | Wt 171.0 lb

## 2014-05-12 DIAGNOSIS — F411 Generalized anxiety disorder: Secondary | ICD-10-CM

## 2014-05-12 DIAGNOSIS — Z0000 Encounter for general adult medical examination without abnormal findings: Secondary | ICD-10-CM

## 2014-05-12 DIAGNOSIS — E785 Hyperlipidemia, unspecified: Secondary | ICD-10-CM

## 2014-05-12 DIAGNOSIS — I495 Sick sinus syndrome: Secondary | ICD-10-CM

## 2014-05-12 DIAGNOSIS — E559 Vitamin D deficiency, unspecified: Secondary | ICD-10-CM | POA: Insufficient documentation

## 2014-05-12 DIAGNOSIS — I499 Cardiac arrhythmia, unspecified: Secondary | ICD-10-CM

## 2014-05-12 DIAGNOSIS — F419 Anxiety disorder, unspecified: Secondary | ICD-10-CM

## 2014-05-12 LAB — COMPLETE METABOLIC PANEL WITH GFR
ALBUMIN: 4.3 g/dL (ref 3.5–5.2)
ALK PHOS: 75 U/L (ref 39–117)
ALT: 19 U/L (ref 0–35)
AST: 16 U/L (ref 0–37)
BUN: 17 mg/dL (ref 6–23)
CHLORIDE: 104 meq/L (ref 96–112)
CO2: 27 mEq/L (ref 19–32)
Calcium: 9.4 mg/dL (ref 8.4–10.5)
Creat: 0.75 mg/dL (ref 0.50–1.10)
GFR, Est African American: >89 mL/min
GFR, Est Non African American: >89 mL/min
Glucose, Bld: 74 mg/dL (ref 70–99)
Potassium: 4.8 mEq/L (ref 3.5–5.3)
SODIUM: 138 meq/L (ref 135–145)
TOTAL PROTEIN: 6.7 g/dL (ref 6.0–8.3)
Total Bilirubin: 0.6 mg/dL (ref 0.2–1.2)

## 2014-05-12 LAB — CBC WITH DIFFERENTIAL/PLATELET
BASOS ABS: 0.1 10*3/uL (ref 0.0–0.1)
BASOS PCT: 1 % (ref 0–1)
Eosinophils Absolute: 0.1 10*3/uL (ref 0.0–0.7)
Eosinophils Relative: 2 % (ref 0–5)
HCT: 38.4 % (ref 36.0–46.0)
Hemoglobin: 12.8 g/dL (ref 12.0–15.0)
LYMPHS PCT: 26 % (ref 12–46)
Lymphs Abs: 1.4 10*3/uL (ref 0.7–4.0)
MCH: 29.5 pg (ref 26.0–34.0)
MCHC: 33.3 g/dL (ref 30.0–36.0)
MCV: 88.5 fL (ref 78.0–100.0)
Monocytes Absolute: 0.3 10*3/uL (ref 0.1–1.0)
Monocytes Relative: 6 % (ref 3–12)
NEUTROS PCT: 65 % (ref 43–77)
Neutro Abs: 3.4 10*3/uL (ref 1.7–7.7)
PLATELETS: 250 10*3/uL (ref 150–400)
RBC: 4.34 MIL/uL (ref 3.87–5.11)
RDW: 13.8 % (ref 11.5–15.5)
WBC: 5.2 10*3/uL (ref 4.0–10.5)

## 2014-05-12 LAB — TSH: TSH: 1 u[IU]/mL (ref 0.350–4.500)

## 2014-05-12 LAB — LIPID PANEL
CHOL/HDL RATIO: 2.7 ratio
CHOLESTEROL: 202 mg/dL — AB (ref 0–200)
HDL: 75 mg/dL (ref >39)
LDL CALC: 113 mg/dL — AB (ref 0–99)
Triglycerides: 71 mg/dL (ref <150)
VLDL: 14 mg/dL (ref 0–40)

## 2014-05-12 LAB — POCT URINALYSIS DIPSTICK
Bilirubin, UA: NEGATIVE
Blood, UA: NEGATIVE
Glucose, UA: NEGATIVE
Ketones, UA: NEGATIVE
LEUKOCYTES UA: NEGATIVE
Nitrite, UA: NEGATIVE
Protein, UA: NEGATIVE
Spec Grav, UA: 1.015
UROBILINOGEN UA: NEGATIVE
pH, UA: 6.5

## 2014-05-12 NOTE — Progress Notes (Signed)
Subjective:    Patient ID: Karina Barnett, female    DOB: 07/06/1966, 48 y.o.   MRN: [id number]  HPI Karina Barnett is here for CPE  HM:  UTD with mm, pap done Dr. Ophelia Charter.  She had influenza done  With work   R knee pain post running injury.    Pt reports  improvements but pain will persist.  brusing is healed   She is going to see Dr. Barbaraann Barthel as she would like to continue running  Pharyngitis resolved   Low vitamin D  Pt taking daily      Allergies  Allergen Reactions  . Flexeril [Cyclobenzaprine Hcl] Other (See Comments)    Causes severe joint pain  . Penicillins Rash   Past Medical History  Diagnosis Date  . Abnormal mammogram   . Abnormal Pap smear   . Cervical spine pain     C5-C6 compression  . Anemia   . Anxiety   . Overweight (BMI 25.0-29.9)    No past surgical history on file. History   Social History  . Marital Status: Married    Spouse Name: N/A    Number of Children: N/A  . Years of Education: N/A   Occupational History  . Not on file.   Social History Main Topics  . Smoking status: Never Smoker   . Smokeless tobacco: Never Used  . Alcohol Use: Yes     Comment: social, occasional  . Drug Use: No  . Sexual Activity: Yes    Birth Control/ Protection: IUD   Other Topics Concern  . Not on file   Social History Narrative  . No narrative on file   Family History  Problem Relation Age of Onset  . Hypertension Mother   . Irritable bowel syndrome Mother   . Arthritis Maternal Grandmother   . Hypertension Maternal Grandfather   . Heart disease Maternal Grandfather   . Alzheimer's disease Maternal Grandmother   . Alzheimer's disease Paternal Grandmother    Patient Active Problem List   Diagnosis Date Noted  . Acute pharyngitis 12/30/2013  . Other and unspecified hyperlipidemia 11/18/2012  . Conjunctivitis unspecified 11/18/2012  . Arrhythmia, sinus node 11/18/2012  . H/O Bell's palsy 11/06/2011  . Anemia   . Anxiety   . Abnormal Pap  smear   . Cervical spine pain    Current Outpatient Prescriptions on File Prior to Visit  Medication Sig Dispense Refill  . acyclovir (ZOVIRAX) 200 MG capsule Take 200 mg by mouth 2 (two) times daily as needed.      . ALPRAZolam (XANAX) 0.25 MG tablet Take 1 tablet (0.25 mg total) by mouth at bedtime as needed.  30 tablet  1  . azithromycin (ZITHROMAX) 250 MG tablet Take as directed  6 tablet  0  . HYDROcodone-homatropine (HYCODAN) 5-1.5 MG/5ML syrup Take 5 mLs by mouth every 6 (six) hours as needed for cough.  120 mL  0  . ibuprofen (ADVIL,MOTRIN) 200 MG tablet Take 400 mg by mouth every 6 (six) hours as needed.      Marland Kitchen levonorgestrel (MIRENA) 20 MCG/24HR IUD 1 each by Intrauterine route once. 2009      . Multiple Vitamins-Calcium (ONE-A-DAY WOMENS PO) Take 1 tablet by mouth.      . Olopatadine HCl (PATADAY) 0.2 % SOLN Apply 2 gtts to eye when done with  Ciloxan  2.5 Bottle  0   No current facility-administered medications on file prior to visit.       Review of  Systems See HPI    Objective:   Physical Exam Physical Exam  Vital signs and nursing note reviewed  Constitutional: She is oriented to person, place, and time. She appears well-developed and well-nourished. She is cooperative.  HENT:  Head: Normocephalic and atraumatic.  Right Ear: Tympanic membrane normal.  Left Ear: Tympanic membrane normal.  Nose: Nose normal.  Mouth/Throat: Oropharynx is clear and moist and mucous membranes are normal. No oropharyngeal exudate or posterior oropharyngeal erythema.  Eyes: Conjunctivae and EOM are normal. Pupils are equal, round, and reactive to light.  Neck: Neck supple. No JVD present. Carotid bruit is not present. No mass and no thyromegaly present.  Cardiovascular: Regular rhythm, normal heart sounds, intact distal pulses and normal pulses.  Exam reveals no gallop and no friction rub.   No murmur heard. Pulses:      Dorsalis pedis pulses are 2+ on the right side, and 2+ on the  left side.  Pulmonary/Chest: Breath sounds normal. She has no wheezes. She has no rhonchi. She has no rales. Right breast exhibits no mass, no nipple discharge and no skin change. Left breast exhibits no mass, no nipple discharge and no skin change.  Abdominal: Soft. Bowel sounds are normal. She exhibits no distension and no mass. There is no hepatosplenomegaly. There is no tenderness. There is no CVA tenderness.  Genitourinary: Rectum normal, vagina normal and uterus normal. Rectal exam shows no mass. Guaiac negative stool. No labial fusion. There is no lesion on the right labia. There is no lesion on the left labia. Cervix exhibits no motion tenderness. Right adnexum displays no mass, no tenderness and no fullness. Left adnexum displays no mass, no tenderness and no fullness. No erythema around the vagina.  Musculoskeletal:       No active synovitis to any joint.    Lymphadenopathy:       Right cervical: No superficial cervical adenopathy present.      Left cervical: No superficial cervical adenopathy present.       Right axillary: No pectoral and no lateral adenopathy present.       Left axillary: No pectoral and no lateral adenopathy present.      Right: No inguinal adenopathy present.       Left: No inguinal adenopathy present.  Neurological: She is alert and oriented to person, place, and time. She has normal strength and normal reflexes. No cranial nerve deficit or sensory deficit. She displays a negative Romberg sign. Coordination and gait normal.  Skin: Skin is warm and dry. No abrasion, no bruising, no ecchymosis and no rash noted. No cyanosis. Nails show no clubbing.  Psychiatric: She has a normal mood and affect. Her speech is normal and behavior is normal.          Assessment & Plan:   HM:  Pap per Dr. Radene Knee   Mm UTD.  Pt non-smoker  R knee pain  Improving but will make appt. With Dr Barbaraann Barthel   Minimal hyperlipidemia   DASH diet given  Will get labs today    See me as  needed       Assessment & Plan:

## 2014-05-12 NOTE — Patient Instructions (Signed)
Give pt number To Dr. Delman Cheadle and Dr. Renda Rolls  Pt will schedule appointment   To lab today    See me as needed

## 2014-05-13 LAB — VITAMIN D 25 HYDROXY (VIT D DEFICIENCY, FRACTURES): Vit D, 25-Hydroxy: 43 ng/mL (ref 30–89)

## 2014-05-25 ENCOUNTER — Encounter: Payer: Self-pay | Admitting: Family Medicine

## 2014-05-25 ENCOUNTER — Ambulatory Visit (INDEPENDENT_AMBULATORY_CARE_PROVIDER_SITE_OTHER): Payer: 59 | Admitting: Family Medicine

## 2014-05-25 VITALS — BP 114/76 | HR 58 | Ht 66.0 in | Wt 167.0 lb

## 2014-05-25 DIAGNOSIS — M25569 Pain in unspecified knee: Secondary | ICD-10-CM

## 2014-05-25 DIAGNOSIS — M25562 Pain in left knee: Secondary | ICD-10-CM

## 2014-05-25 NOTE — Patient Instructions (Signed)
You have patellofemoral syndrome, probable underlying mild arthritis as well. Avoid deep squats, lunges, leg press, plyometrics. Cross train with swimming, cycling with low resistance, elliptical if needed. Straight leg raise, straight leg raise with foot turned outwards, and lateral side raises 3 sets of 10 once a day. Add ankle weights if these become too easy. Consider formal physical therapy if you are struggling. Try dr. Zoe Lan active series insoles (take insert out of your running shoes and put these in there). Icing 15 minutes at a time 3-4 times a day as needed. Tylenol, ibuprofen, or aleve as needed for pain If not limping and pain is less than a 3 on a scale of 1-10 ok to restart walk:jog program as we discussed, increasing your jog time every other day. I would recommend waiting about 1 week (but doing the rehab exercises every day during that time) before returning to the walk:jog program. Follow up with me in 6 weeks for reevaluation.

## 2014-05-26 ENCOUNTER — Encounter: Payer: Self-pay | Admitting: Family Medicine

## 2014-05-26 DIAGNOSIS — M25562 Pain in left knee: Secondary | ICD-10-CM | POA: Insufficient documentation

## 2014-05-26 HISTORY — DX: Pain in left knee: M25.562

## 2014-05-26 NOTE — Progress Notes (Signed)
Patient ID: Karina Barnett, female   DOB: 11-10-1965, 48 y.o.   MRN: [id number]  PCP: Kelton Pillar, MD  Subjective:   HPI: Patient is a 48 y.o. female here for left knee pain.  Patient reports she's had left knee pain for about 6 weeks. 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 Had to stop running due to pain initially behind knee and now in front of left knee. Tried a sleeve and icing for 2 weeks which has helped. Feels like knee is going to catch. Felt tight initially as well. No giving out, locking.  Past Medical History  Diagnosis Date  . Abnormal mammogram   . Abnormal Pap smear   . Cervical spine pain     C5-C6 compression  . Anemia   . Anxiety   . Overweight (BMI 25.0-29.9)     Current Outpatient Prescriptions on File Prior to Visit  Medication Sig Dispense Refill  . acyclovir (ZOVIRAX) 200 MG capsule Take 200 mg by mouth 2 (two) times daily as needed.      . ALPRAZolam (XANAX) 0.25 MG tablet Take 1 tablet (0.25 mg total) by mouth at bedtime as needed.  30 tablet  1  . ibuprofen (ADVIL,MOTRIN) 200 MG tablet Take 400 mg by mouth every 6 (six) hours as needed.      Marland Kitchen levonorgestrel (MIRENA) 20 MCG/24HR IUD 1 each by Intrauterine route once. 2009      . Multiple Vitamins-Calcium (ONE-A-DAY WOMENS PO) Take 1 tablet by mouth.      . Olopatadine HCl (PATADAY) 0.2 % SOLN Apply 2 gtts to eye when done with  Ciloxan  2.5 Bottle  0   No current facility-administered medications on file prior to visit.    History reviewed. No pertinent past surgical history.  Allergies  Allergen Reactions  . Flexeril [Cyclobenzaprine Hcl] Other (See Comments)    Causes severe joint pain  . Penicillins Rash    History   Social History  . Marital Status: Married    Spouse Name: N/A    Number of Children: N/A  . Years of Education: N/A   Occupational History  . Not on file.   Social History Main Topics  . Smoking status: Never Smoker   .  Smokeless tobacco: Never Used  . Alcohol Use: Yes     Comment: social, occasional  . Drug Use: No  . Sexual Activity: Yes    Birth Control/ Protection: IUD   Other Topics Concern  . Not on file   Social History Narrative  . No narrative on file    Family History  Problem Relation Age of Onset  . Hypertension Mother   . Irritable bowel syndrome Mother   . Arthritis Maternal Grandmother   . Hypertension Maternal Grandfather   . Heart disease Maternal Grandfather   . Alzheimer's disease Maternal Grandmother   . Alzheimer's disease Paternal Grandmother     BP 114/76  Pulse 58  Ht 5\' 6"  (1.676 m)  Wt 167 lb (75.751 kg)  BMI 26.97 kg/m2  Review of Systems: See HPI above.    Objective:  Physical Exam:  Gen: NAD  Left knee: Mild overpronation but moderate on right. No gross deformity, ecchymoses, swelling.  Mild vmo atrophy. TTP mildly around patella.  No joint line, posterior knee tenderness. FROM. Negative ant/post drawers. Negative valgus/varus testing. Negative lachmanns. Negative mcmurrays, apleys, patellar apprehension, clarkes. Hip abduction 4/5 strength. NV intact distally.    Assessment &  Plan:  1. Left knee pain - consistent with patellofemoral syndrome.  Shown home exercise program to do daily.  Icing, better arch supports.  Avoid deep squats, lunges, leg press, plyometrics.  Cross train if needed.  Wait about a week before returning to walk:jog program.  Consider PT if not improving.  Tylenol, ibuprofen, or aleve as needed for pain  Follow up with me in 6 weeks for reevaluation.

## 2014-05-26 NOTE — Assessment & Plan Note (Signed)
consistent with patellofemoral syndrome.  Shown home exercise program to do daily.  Icing, better arch supports.  Avoid deep squats, lunges, leg press, plyometrics.  Cross train if needed.  Wait about a week before returning to walk:jog program.  Consider PT if not improving.  Tylenol, ibuprofen, or aleve as needed for pain  Follow up with me in 6 weeks for reevaluation.

## 2014-06-14 ENCOUNTER — Encounter: Payer: 59 | Admitting: Internal Medicine

## 2014-06-28 ENCOUNTER — Encounter: Payer: Self-pay | Admitting: Family Medicine

## 2014-07-02 ENCOUNTER — Ambulatory Visit: Payer: 59 | Admitting: Family Medicine

## 2014-07-05 ENCOUNTER — Other Ambulatory Visit: Payer: Self-pay | Admitting: *Deleted

## 2014-07-05 MED ORDER — ACYCLOVIR 200 MG PO CAPS: 200.0000 mg | ORAL_CAPSULE | Freq: Two times a day (BID) | ORAL | Status: DC | PRN

## 2014-07-05 NOTE — Telephone Encounter (Signed)
refill  Request

## 2014-08-04 ENCOUNTER — Ambulatory Visit (INDEPENDENT_AMBULATORY_CARE_PROVIDER_SITE_OTHER): Payer: 59 | Admitting: Internal Medicine

## 2014-08-04 ENCOUNTER — Encounter: Payer: Self-pay | Admitting: Internal Medicine

## 2014-08-04 VITALS — BP 100/60 | HR 69 | Resp 16 | Ht 66.5 in | Wt 172.0 lb

## 2014-08-04 DIAGNOSIS — J381 Polyp of vocal cord and larynx: Secondary | ICD-10-CM

## 2014-08-04 DIAGNOSIS — R490 Dysphonia: Secondary | ICD-10-CM

## 2014-08-04 MED ORDER — PREDNISONE 20 MG PO TABS: ORAL_TABLET | ORAL | Status: DC

## 2014-08-04 MED ORDER — METHYLPREDNISOLONE ACETATE 80 MG/ML IJ SUSP
80.0000 mg | Freq: Once | INTRAMUSCULAR | Status: AC
  Administered 2014-08-04: 80 mg via INTRAMUSCULAR

## 2014-08-04 MED ORDER — PANTOPRAZOLE SODIUM 40 MG PO TBEC: 40.0000 mg | DELAYED_RELEASE_TABLET | Freq: Every day | ORAL | Status: DC

## 2014-08-04 MED ORDER — AZITHROMYCIN 250 MG PO TABS: ORAL_TABLET | ORAL | Status: DC

## 2014-08-04 NOTE — Progress Notes (Signed)
Subjective:    Patient ID: Karina Barnett, female    DOB: 07-30-1966, 48 y.o.   MRN: [id number]  HPI  Karina Barnett is here for acute visit.   Reports was evaluated by ENT chris newman and told she has a small hemorrahagic polyp found on endoscopic eval in his office 11/6  Advised strict voice rest which she has been unable to do  Voice increasingly raspy and hoarse and she is wondering what non-surgical options there are for treatment.  She will have an upcoming vacation from the hospital in about 10 days.   No fever no recent URI   Allergies  Allergen Reactions  . Flexeril [Cyclobenzaprine Hcl] Other (See Comments)    Causes severe joint pain  . Penicillins Rash   Past Medical History  Diagnosis Date  . Abnormal mammogram   . Abnormal Pap smear   . Cervical spine pain     C5-C6 compression  . Anemia   . Anxiety   . Overweight (BMI 25.0-29.9)   . Vocal cord polyp    History reviewed. No pertinent past surgical history. History   Social History  . Marital Status: Married    Spouse Name: N/A    Number of Children: N/A  . Years of Education: N/A   Occupational History  . Not on file.   Social History Main Topics  . Smoking status: Never Smoker   . Smokeless tobacco: Never Used  . Alcohol Use: Yes     Comment: social, occasional  . Drug Use: No  . Sexual Activity: Yes    Birth Control/ Protection: IUD   Other Topics Concern  . Not on file   Social History Narrative   Family History  Problem Relation Age of Onset  . Hypertension Mother   . Irritable bowel syndrome Mother   . Arthritis Maternal Grandmother   . Hypertension Maternal Grandfather   . Heart disease Maternal Grandfather   . Alzheimer's disease Maternal Grandmother   . Alzheimer's disease Paternal Grandmother    Patient Active Problem List   Diagnosis Date Noted  . Left knee pain 05/26/2014  . Vitamin D deficiency 05/12/2014  . Acute pharyngitis 12/30/2013  . Other and unspecified  hyperlipidemia 11/18/2012  . Conjunctivitis unspecified 11/18/2012  . Arrhythmia, sinus node 11/18/2012  . H/O Bell's palsy 11/06/2011  . Anemia   . Anxiety   . Abnormal Pap smear   . Cervical spine pain    Current Outpatient Prescriptions on File Prior to Visit  Medication Sig Dispense Refill  . acyclovir (ZOVIRAX) 200 MG capsule Take 1 capsule (200 mg total) by mouth 2 (two) times daily as needed. 10 capsule 1  . ALPRAZolam (XANAX) 0.25 MG tablet Take 1 tablet (0.25 mg total) by mouth at bedtime as needed. 30 tablet 1  . ibuprofen (ADVIL,MOTRIN) 200 MG tablet Take 400 mg by mouth every 6 (six) hours as needed.    Marland Kitchen levonorgestrel (MIRENA) 20 MCG/24HR IUD 1 each by Intrauterine route once. 2009    . Multiple Vitamins-Calcium (ONE-A-DAY WOMENS PO) Take 1 tablet by mouth.    . Olopatadine HCl (PATADAY) 0.2 % SOLN Apply 2 gtts to eye when done with  Ciloxan 2.5 Bottle 0   No current facility-administered medications on file prior to visit.     Review of Systems See HPI    Objective:   Physical Exam Physical Exam  Nursing note and vitals reviewed.  Constitutional: She is oriented to person, place, and time. She appears well-developed  and well-nourished.  HENT:  Head: Normocephalic and atraumatic.  O/P  Visualized portions of oropharnynx essentially normal  .  No cervical adenopathy Cardiovascular: Normal rate and regular rhythm. Exam reveals no gallop and no friction rub.  No murmur heard.  Pulmonary/Chest: Breath sounds normal. She has no wheezes. She has no rales.  Neurological: She is alert and oriented to person, place, and time.  Skin: Skin is warm and dry.  Psychiatric: She has a normal mood and affect. Her behavior is normal.              Assessment & Plan:  Hemmorrhagic vocal chord polyp:  Will empirically try short course of steroid.  Depo-medrol 80 mg given in office with 40 mg prednsiose taper q 3 days.   Will also try to suppress and stomach acid with short  cours of Protonix   She really needs voice rest and I strongly encouraged this.   If above does not help, she can stop meds and needs to follow with her ENT.   I Also printed a RX for Z-pak if pt developes any URI symptoms or sore throat that does not resolve with above interventions   She is not to fill this now   Hoarseness  See above

## 2014-08-06 ENCOUNTER — Ambulatory Visit: Payer: 59 | Admitting: Family Medicine

## 2014-08-09 ENCOUNTER — Encounter: Payer: Self-pay | Admitting: *Deleted

## 2014-08-16 ENCOUNTER — Ambulatory Visit: Payer: 59 | Admitting: Family Medicine

## 2014-08-30 ENCOUNTER — Encounter: Payer: 59 | Admitting: Internal Medicine

## 2014-08-30 NOTE — Progress Notes (Signed)
Subjective:    Patient ID: Karina Barnett, female    DOB: 06-Jul-1966, 49 y.o.   MRN: [id number]  HPI   Today's Update:  07/2014 Hemmorrhagic vocal chord polyp: Will empirically try short course of steroid. Depo-medrol 80 mg given in office with 40 mg prednsiose taper q 3 days. Will also try to suppress and stomach acid with short cours of Protonix  She really needs voice rest and I strongly encouraged this. If above does not help, she can stop meds and needs to follow with her ENT.   I Also printed a RX for Z-pak if pt developes any URI symptoms or sore throat that does not resolve with above interventions She is not to fill this now   Hoarseness See above     Allergies  Allergen Reactions  . Flexeril [Cyclobenzaprine Hcl] Other (See Comments)    Causes severe joint pain  . Penicillins Rash   Past Medical History  Diagnosis Date  . Abnormal mammogram   . Abnormal Pap smear   . Cervical spine pain     C5-C6 compression  . Anemia   . Anxiety   . Overweight (BMI 25.0-29.9)   . Vocal cord polyp    No past surgical history on file. History   Social History  . Marital Status: Married    Spouse Name: N/A    Number of Children: N/A  . Years of Education: N/A   Occupational History  . Not on file.   Social History Main Topics  . Smoking status: Never Smoker   . Smokeless tobacco: Never Used  . Alcohol Use: Yes     Comment: social, occasional  . Drug Use: No  . Sexual Activity: Yes    Birth Control/ Protection: IUD   Other Topics Concern  . Not on file   Social History Narrative   Family History  Problem Relation Age of Onset  . Hypertension Mother   . Irritable bowel syndrome Mother   . Arthritis Maternal Grandmother   . Hypertension Maternal Grandfather   . Heart disease Maternal Grandfather   . Alzheimer's disease Maternal Grandmother   . Alzheimer's disease Paternal Grandmother    Patient Active Problem List   Diagnosis Date Noted    . Left knee pain 05/26/2014  . Vitamin D deficiency 05/12/2014  . Acute pharyngitis 12/30/2013  . Other and unspecified hyperlipidemia 11/18/2012  . Conjunctivitis unspecified 11/18/2012  . Arrhythmia, sinus node 11/18/2012  . H/O Bell's palsy 11/06/2011  . Anemia   . Anxiety   . Abnormal Pap smear   . Cervical spine pain    Current Outpatient Prescriptions on File Prior to Visit  Medication Sig Dispense Refill  . acyclovir (ZOVIRAX) 200 MG capsule Take 1 capsule (200 mg total) by mouth 2 (two) times daily as needed. 10 capsule 1  . ALPRAZolam (XANAX) 0.25 MG tablet Take 1 tablet (0.25 mg total) by mouth at bedtime as needed. 30 tablet 1  . azithromycin (ZITHROMAX) 250 MG tablet Take as directed 6 tablet 0  . ibuprofen (ADVIL,MOTRIN) 200 MG tablet Take 400 mg by mouth every 6 (six) hours as needed.    Marland Kitchen levonorgestrel (MIRENA) 20 MCG/24HR IUD 1 each by Intrauterine route once. 2009    . Multiple Vitamins-Calcium (ONE-A-DAY WOMENS PO) Take 1 tablet by mouth.    . Olopatadine HCl (PATADAY) 0.2 % SOLN Apply 2 gtts to eye when done with  Ciloxan 2.5 Bottle 0  . pantoprazole (PROTONIX) 40 MG tablet Take  1 tablet (40 mg total) by mouth daily. 30 tablet 3  . predniSONE (DELTASONE) 20 MG tablet Two tablets for 3 days then 1 tablet for 3 days then stop 9 tablet 0   No current facility-administered medications on file prior to visit.      Review of Systems    see HPI Objective:   Physical Exam Physical Exam  Nursing note and vitals reviewed.  Constitutional: She is oriented to person, place, and time. She appears well-developed and well-nourished.  HENT:  Head: Normocephalic and atraumatic.  Cardiovascular: Normal rate and regular rhythm. Exam reveals no gallop and no friction rub.  No murmur heard.  Pulmonary/Chest: Breath sounds normal. She has no wheezes. She has no rales.  Neurological: She is alert and oriented to person, place, and time.  Skin: Skin is warm and dry.   Psychiatric: She has a normal mood and affect. Her behavior is normal.              Assessment & Plan:  Hemmorhagic vocal chord polyp

## 2014-09-07 ENCOUNTER — Encounter: Payer: Self-pay | Admitting: Internal Medicine

## 2014-09-07 ENCOUNTER — Ambulatory Visit (INDEPENDENT_AMBULATORY_CARE_PROVIDER_SITE_OTHER): Payer: 59 | Admitting: Internal Medicine

## 2014-09-07 VITALS — BP 111/65 | HR 57 | Temp 97.6°F | Resp 16 | Ht 66.5 in | Wt 175.0 lb

## 2014-09-07 DIAGNOSIS — J029 Acute pharyngitis, unspecified: Secondary | ICD-10-CM

## 2014-09-07 DIAGNOSIS — R490 Dysphonia: Secondary | ICD-10-CM

## 2014-09-07 DIAGNOSIS — J381 Polyp of vocal cord and larynx: Secondary | ICD-10-CM

## 2014-09-07 MED ORDER — HYDROCODONE-HOMATROPINE 5-1.5 MG/5ML PO SYRP: 5.0000 mL | ORAL_SOLUTION | Freq: Three times a day (TID) | ORAL | Status: DC | PRN

## 2014-09-07 NOTE — Progress Notes (Signed)
Subjective:    Patient ID: Karina Barnett, female    DOB: 1966-01-24, 49 y.o.   MRN: [id number]  HPI  08/04/2014 note  Hemmorrhagic vocal chord polyp: Will empirically try short course of steroid. Depo-medrol 80 mg given in office with 40 mg prednsiose taper q 3 days. Will also try to suppress and stomach acid with short cours of Protonix  She really needs voice rest and I strongly encouraged this. If above does not help, she can stop meds and needs to follow with her ENT.   I Also printed a RX for Z-pak if pt developes any URI symptoms or sore throat that does not resolve with above interventions She is not to fill this now   Hoarseness See above  TODAY:    Hoarseness improved with steroid and protonix.        Vocal chord polyp  She will have follow up with ENT  She had sore throat yesterday and feeling very fatigues today   Allergies  Allergen Reactions  . Flexeril [Cyclobenzaprine Hcl] Other (See Comments)    Causes severe joint pain  . Penicillins Rash   Past Medical History  Diagnosis Date  . Abnormal mammogram   . Abnormal Pap smear   . Cervical spine pain     C5-C6 compression  . Anemia   . Anxiety   . Overweight (BMI 25.0-29.9)   . Vocal cord polyp    No past surgical history on file. History   Social History  . Marital Status: Married    Spouse Name: N/A    Number of Children: N/A  . Years of Education: N/A   Occupational History  . Not on file.   Social History Main Topics  . Smoking status: Never Smoker   . Smokeless tobacco: Never Used  . Alcohol Use: Yes     Comment: social, occasional  . Drug Use: No  . Sexual Activity: Yes    Birth Control/ Protection: IUD   Other Topics Concern  . Not on file   Social History Narrative   Family History  Problem Relation Age of Onset  . Hypertension Mother   . Irritable bowel syndrome Mother   . Arthritis Maternal Grandmother   . Hypertension Maternal Grandfather   . Heart disease  Maternal Grandfather   . Alzheimer's disease Maternal Grandmother   . Alzheimer's disease Paternal Grandmother    Patient Active Problem List   Diagnosis Date Noted  . Left knee pain 05/26/2014  . Vitamin D deficiency 05/12/2014  . Acute pharyngitis 12/30/2013  . Other and unspecified hyperlipidemia 11/18/2012  . Conjunctivitis unspecified 11/18/2012  . Arrhythmia, sinus node 11/18/2012  . H/O Bell's palsy 11/06/2011  . Anemia   . Anxiety   . Abnormal Pap smear   . Cervical spine pain    Current Outpatient Prescriptions on File Prior to Visit  Medication Sig Dispense Refill  . acyclovir (ZOVIRAX) 200 MG capsule Take 1 capsule (200 mg total) by mouth 2 (two) times daily as needed. 10 capsule 1  . ALPRAZolam (XANAX) 0.25 MG tablet Take 1 tablet (0.25 mg total) by mouth at bedtime as needed. 30 tablet 1  . azithromycin (ZITHROMAX) 250 MG tablet Take as directed 6 tablet 0  . ibuprofen (ADVIL,MOTRIN) 200 MG tablet Take 400 mg by mouth every 6 (six) hours as needed.    Marland Kitchen levonorgestrel (MIRENA) 20 MCG/24HR IUD 1 each by Intrauterine route once. 2009    . Multiple Vitamins-Calcium (ONE-A-DAY WOMENS PO) Take  1 tablet by mouth.    . Olopatadine HCl (PATADAY) 0.2 % SOLN Apply 2 gtts to eye when done with  Ciloxan 2.5 Bottle 0  . pantoprazole (PROTONIX) 40 MG tablet Take 1 tablet (40 mg total) by mouth daily. 30 tablet 3  . predniSONE (DELTASONE) 20 MG tablet Two tablets for 3 days then 1 tablet for 3 days then stop 9 tablet 0   No current facility-administered medications on file prior to visit.      Review of Systems    see HPI Objective:   Physical Exam Physical Exam  Nursing note and vitals reviewed.  Constitutional: She is oriented to person, place, and time. She appears well-developed and well-nourished.  HENT:  Head: Normocephalic and atraumatic.  O/P  Erythematous  No exudates Cardiovascular: Normal rate and regular rhythm. Exam reveals no gallop and no friction rub.  No  murmur heard.  Pulmonary/Chest: Breath sounds normal. She has no wheezes. She has no rales.  Neurological: She is alert and oriented to person, place, and time.  Skin: Skin is warm and dry.  Psychiatric: She has a normal mood and affect. Her behavior is normal.        Assessment & Plan:  Hoarseness improved advised to follow up with ENT  Vocal chord polyp  See above  Pharyngtitis   She has Z-pak that she did not fill as yet.  She will be going to [place] this week-end  Ok to fill RX with Hycodan to take on trip

## 2014-11-17 ENCOUNTER — Other Ambulatory Visit: Payer: Self-pay | Admitting: *Deleted

## 2014-11-17 NOTE — Telephone Encounter (Signed)
Refill request

## 2014-11-18 MED ORDER — OLOPATADINE HCL 0.2 % OP SOLN: OPHTHALMIC | Status: DC

## 2015-01-06 ENCOUNTER — Encounter: Payer: 59 | Admitting: Internal Medicine

## 2015-01-06 NOTE — Progress Notes (Signed)
Subjective:    Patient ID: Karina Barnett, female    DOB: August 12, 1966, 49 y.o.   MRN: [id number]  HPI  08/2014  Assessment & Plan:  Hoarseness improved advised to follow up with ENT  Vocal chord polyp See above  Pharyngtitis She has Z-pak that she did not fill as yet. She will be going to [place] this week-end Ok to fill RX with Hycodan to take on trip          TODAY  Allergies  Allergen Reactions  . Flexeril [Cyclobenzaprine Hcl] Other (See Comments)    Causes severe joint pain  . Penicillins Rash   Past Medical History  Diagnosis Date  . Abnormal mammogram   . Abnormal Pap smear   . Cervical spine pain     C5-C6 compression  . Anemia   . Anxiety   . Overweight (BMI 25.0-29.9)   . Vocal cord polyp    No past surgical history on file. History   Social History  . Marital Status: Married    Spouse Name: N/A  . Number of Children: N/A  . Years of Education: N/A   Occupational History  . Not on file.   Social History Main Topics  . Smoking status: Never Smoker   . Smokeless tobacco: Never Used  . Alcohol Use: Yes     Comment: social, occasional  . Drug Use: No  . Sexual Activity: Yes    Birth Control/ Protection: IUD   Other Topics Concern  . Not on file   Social History Narrative   Family History  Problem Relation Age of Onset  . Hypertension Mother   . Irritable bowel syndrome Mother   . Arthritis Maternal Grandmother   . Hypertension Maternal Grandfather   . Heart disease Maternal Grandfather   . Alzheimer's disease Maternal Grandmother   . Alzheimer's disease Paternal Grandmother    Patient Active Problem List   Diagnosis Date Noted  . Left knee pain 05/26/2014  . Vitamin D deficiency 05/12/2014  . Acute pharyngitis 12/30/2013  . Other and unspecified hyperlipidemia 11/18/2012  . Conjunctivitis unspecified 11/18/2012  . Arrhythmia, sinus node 11/18/2012  . H/O Bell's palsy 11/06/2011  . Anemia   . Anxiety   . Abnormal Pap  smear   . Cervical spine pain    Current Outpatient Prescriptions on File Prior to Visit  Medication Sig Dispense Refill  . acyclovir (ZOVIRAX) 200 MG capsule Take 1 capsule (200 mg total) by mouth 2 (two) times daily as needed. 10 capsule 1  . ALPRAZolam (XANAX) 0.25 MG tablet Take 1 tablet (0.25 mg total) by mouth at bedtime as needed. 30 tablet 1  . HYDROcodone-homatropine (HYCODAN) 5-1.5 MG/5ML syrup Take 5 mLs by mouth every 8 (eight) hours as needed for cough. 120 mL 0  . ibuprofen (ADVIL,MOTRIN) 200 MG tablet Take 400 mg by mouth every 6 (six) hours as needed.    Marland Kitchen levonorgestrel (MIRENA) 20 MCG/24HR IUD 1 each by Intrauterine route once. 2009    . Multiple Vitamins-Calcium (ONE-A-DAY WOMENS PO) Take 1 tablet by mouth.    . Olopatadine HCl (PATADAY) 0.2 % SOLN Apply 2 gtts to affected eye daily prn 2.5 Bottle 0  . pantoprazole (PROTONIX) 40 MG tablet Take 1 tablet (40 mg total) by mouth daily. 30 tablet 3   No current facility-administered medications on file prior to visit.      Review of Systems See HPI    Objective:   Physical Exam  Physical Exam  Nursing note and vitals reviewed.  Constitutional: She is oriented to person, place, and time. She appears well-developed and well-nourished.  HENT:  Head: Normocephalic and atraumatic.  Cardiovascular: Normal rate and regular rhythm. Exam reveals no gallop and no friction rub.  No murmur heard.  Pulmonary/Chest: Breath sounds normal. She has no wheezes. She has no rales.  Neurological: She is alert and oriented to person, place, and time.  Skin: Skin is warm and dry.  Psychiatric: She has a normal mood and affect. Her behavior is normal.             Assessment & Plan:

## 2015-01-07 ENCOUNTER — Other Ambulatory Visit: Payer: Self-pay | Admitting: Internal Medicine

## 2015-01-07 MED ORDER — PANTOPRAZOLE SODIUM 40 MG PO TBEC: 40.0000 mg | DELAYED_RELEASE_TABLET | Freq: Every day | ORAL | Status: DC

## 2015-04-05 ENCOUNTER — Other Ambulatory Visit: Payer: Self-pay | Admitting: Obstetrics and Gynecology

## 2015-04-05 DIAGNOSIS — Z1231 Encounter for screening mammogram for malignant neoplasm of breast: Secondary | ICD-10-CM

## 2015-05-16 ENCOUNTER — Ambulatory Visit
Admission: RE | Admit: 2015-05-16 | Discharge: 2015-05-16 | Disposition: A | Discharge status: Home / Self Care | Payer: 59 | Source: Ambulatory Visit | Attending: Obstetrics and Gynecology | Admitting: Obstetrics and Gynecology

## 2015-05-16 DIAGNOSIS — Z1231 Encounter for screening mammogram for malignant neoplasm of breast: Secondary | ICD-10-CM

## 2015-09-20 DIAGNOSIS — F4320 Adjustment disorder, unspecified: Secondary | ICD-10-CM | POA: Diagnosis not present

## 2016-01-18 DIAGNOSIS — H524 Presbyopia: Secondary | ICD-10-CM | POA: Diagnosis not present

## 2016-01-18 DIAGNOSIS — H52221 Regular astigmatism, right eye: Secondary | ICD-10-CM | POA: Diagnosis not present

## 2016-01-18 DIAGNOSIS — H5211 Myopia, right eye: Secondary | ICD-10-CM | POA: Diagnosis not present

## 2016-05-22 DIAGNOSIS — Z6830 Body mass index (BMI) 30.0-30.9, adult: Secondary | ICD-10-CM | POA: Diagnosis not present

## 2016-05-22 DIAGNOSIS — Z01419 Encounter for gynecological examination (general) (routine) without abnormal findings: Secondary | ICD-10-CM | POA: Diagnosis not present

## 2016-05-29 ENCOUNTER — Other Ambulatory Visit: Payer: Self-pay | Admitting: Internal Medicine

## 2016-05-29 DIAGNOSIS — Z23 Encounter for immunization: Secondary | ICD-10-CM | POA: Diagnosis not present

## 2016-05-29 DIAGNOSIS — E7800 Pure hypercholesterolemia, unspecified: Secondary | ICD-10-CM | POA: Diagnosis not present

## 2016-05-29 DIAGNOSIS — Z0000 Encounter for general adult medical examination without abnormal findings: Secondary | ICD-10-CM | POA: Diagnosis not present

## 2016-05-29 DIAGNOSIS — R928 Other abnormal and inconclusive findings on diagnostic imaging of breast: Secondary | ICD-10-CM

## 2016-05-29 DIAGNOSIS — E559 Vitamin D deficiency, unspecified: Secondary | ICD-10-CM | POA: Diagnosis not present

## 2016-05-29 DIAGNOSIS — Z803 Family history of malignant neoplasm of breast: Secondary | ICD-10-CM | POA: Diagnosis not present

## 2016-06-07 ENCOUNTER — Ambulatory Visit
Admission: RE | Admit: 2016-06-07 | Discharge: 2016-06-07 | Disposition: A | Discharge status: Home / Self Care | Payer: 59 | Source: Ambulatory Visit | Attending: Internal Medicine | Admitting: Internal Medicine

## 2016-06-07 DIAGNOSIS — R928 Other abnormal and inconclusive findings on diagnostic imaging of breast: Secondary | ICD-10-CM

## 2016-06-07 DIAGNOSIS — Z1231 Encounter for screening mammogram for malignant neoplasm of breast: Secondary | ICD-10-CM | POA: Diagnosis not present

## 2016-06-20 ENCOUNTER — Encounter: Payer: Self-pay | Admitting: Obstetrics and Gynecology

## 2016-06-26 ENCOUNTER — Telehealth: Payer: Self-pay | Admitting: Genetic Counselor

## 2016-06-26 ENCOUNTER — Encounter: Payer: Self-pay | Admitting: Genetic Counselor

## 2016-06-26 NOTE — Telephone Encounter (Signed)
Appt scheduled w/Kayla Boggs on 12/12 at 9am. Pt aware to arrive 15 minutes early. Demographics verified. Location given. Letter mailed to the pt.

## 2016-07-31 DIAGNOSIS — E559 Vitamin D deficiency, unspecified: Secondary | ICD-10-CM | POA: Diagnosis not present

## 2016-07-31 DIAGNOSIS — R634 Abnormal weight loss: Secondary | ICD-10-CM | POA: Diagnosis not present

## 2016-07-31 DIAGNOSIS — Z23 Encounter for immunization: Secondary | ICD-10-CM | POA: Diagnosis not present

## 2016-07-31 DIAGNOSIS — E7800 Pure hypercholesterolemia, unspecified: Secondary | ICD-10-CM | POA: Diagnosis not present

## 2016-08-06 ENCOUNTER — Encounter: Payer: Self-pay | Admitting: Genetic Counselor

## 2016-08-07 ENCOUNTER — Encounter: Payer: Self-pay | Admitting: Genetic Counselor

## 2016-08-07 ENCOUNTER — Other Ambulatory Visit: Payer: 59

## 2016-08-07 ENCOUNTER — Ambulatory Visit (HOSPITAL_BASED_OUTPATIENT_CLINIC_OR_DEPARTMENT_OTHER): Payer: 59 | Admitting: Genetic Counselor

## 2016-08-07 DIAGNOSIS — Z803 Family history of malignant neoplasm of breast: Secondary | ICD-10-CM | POA: Diagnosis not present

## 2016-08-07 DIAGNOSIS — Z801 Family history of malignant neoplasm of trachea, bronchus and lung: Secondary | ICD-10-CM | POA: Diagnosis not present

## 2016-08-07 DIAGNOSIS — Z315 Encounter for genetic counseling: Secondary | ICD-10-CM

## 2016-08-07 DIAGNOSIS — Z808 Family history of malignant neoplasm of other organs or systems: Secondary | ICD-10-CM | POA: Diagnosis not present

## 2016-08-07 DIAGNOSIS — Z800 Family history of malignant neoplasm of digestive organs: Secondary | ICD-10-CM | POA: Diagnosis not present

## 2016-08-07 NOTE — Progress Notes (Signed)
REFERRING PROVIDER: Lanice Shirts, MD [address] [place], [place] [address]  PRIMARY PROVIDER:  Kelton Pillar, MD  PRIMARY REASON FOR VISIT:  1. Family history of breast cancer in female   2. Family history of pancreatic cancer   3. Family history of lung cancer   4. Family history of skin cancer      HISTORY OF PRESENT ILLNESS:   [name], a 50 y.o. female, was seen for a [facility] cancer genetics consultation at the request of Dr. Coralyn Barnett due to a family history of breast, pancreatic, and other cancers.  [name] presents to clinic today to discuss the possibility of a hereditary predisposition to cancer, genetic testing, and to further clarify her future cancer risks, as well as potential cancer risks for family members.    [name] is a 50 y.o. female with no personal history of cancer.     HORMONAL RISK FACTORS:  Menarche was at age 15.  First live birth at age 82.  OCP use for - hormonal IUD for approximately 10 years; pills for approx 8 years. Ovaries intact: yes.  Hysterectomy: no.  Menopausal status: uncertain - current IUD use.  HRT use: 0 years. Colonoscopy: no, postponed, but is trying to fit in before the end of the year; not examined. Mammogram within the last year: yes. Number of breast biopsies: 0. Up to date with pelvic exams:  yes. 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 Abnormal mammogram   . Abnormal Pap smear   . Anemia   . Anxiety   . Cervical spine pain    C5-C6 compression  . Overweight (BMI 25.0-29.9)   . Vocal cord polyp     History reviewed. No pertinent surgical history.  Social History   Social History  . Marital status: Married    Spouse name: N/A  . Number of children: N/A  . Years of education: N/A   Social History Main  Topics  . Smoking status: Never Smoker  . Smokeless tobacco: Never Used  . Alcohol use Yes     Comment: social, occasional  . Drug use: No  . Sexual activity: Yes    Birth control/ protection: IUD   Other Topics Concern  . None   Social History Narrative  . None     FAMILY HISTORY:  We obtained a detailed, 4-generation family history.  Significant diagnoses are listed below: Family History  Problem Relation Age of Onset  . Hypertension Mother   . Irritable bowel syndrome Mother   . Other Mother     hx of hysterectomy in her late 82s-early 60s for prolapsed uterus  . Arthritis Maternal Grandmother   . Alzheimer's disease Maternal Grandmother     d. 25y  . Hypertension Maternal Grandfather   . Heart disease Maternal Grandfather   . Heart Problems Maternal Grandfather     open heart surgery  . Stroke Maternal Grandfather     d. 32y  . Skin cancer Maternal Grandfather     hx of skin burn; unspecified type  . Alzheimer's disease Paternal Grandmother   . Diabetes Paternal Grandmother     borderline  . Breast cancer Sister 13    DCIS  . Other Sister     dx benign grape-sized tumor of abdomen  . Lung cancer Paternal Grandfather  d. 23; tumor of bronchial arch; hx of chemical inhalation - worked in [employer]  . Other Other     icthyosis  . Breast cancer Cousin 62    paternal 1st cousin   . Pancreatic cancer Cousin 46    paternal 1st cousin d. 71y; smoker    [name] has three daughters, ages 70-25.  She has one full brother and sister, ages 86 and 58.  Her sister was diagnosed with DCIS breast cancer at 74 and also has history of a benign, grapefruit-sized tumor of her abdomen.  Her sister has a daughter who has a hereditary icthyosis skin condition.  [name] brother has never had cancer.    [name] mother is currently 37 and has not had cancer.  Her mother underwent a hysterectomy in her late 50s-early 60s for prolapsed uterus.  Her mother has one full  sister who is currently 51 and has not had cancer.  [name] maternal grandmother passed away from Alzheimer's-related illness at age 35.  Her grandfather passed away from a stroke following open heart surgery at age 68.  Her grandfather has a history of skin cancer.  [name] reports no known cancer for her maternal great aunts/uncles and great grandparents.    [name] father is currently 21 and has not had cancer.  Her father has two full sisters, ages 76 and 109.  [name] is unaware of a history of cancer for her aunts.  One paternal first cousin was diagnosed with breast cancer at 23; another paternal first cousin was diagnosed with pancreatic cancer at 67.  [name] paternal grandmother passed away from alzheimer's-related illness.  Her grandfather passed away from bronchial cancer at 69.  He had a history of chemical inhalation through his history of working at a [employer].  [name] is unaware of any cancer history for other paternal great aunts/uncles and great grandparents.    Patient's maternal ancestors are of [place] descent, and paternal ancestors are of [place] and [place] descent. There is no reported Ashkenazi Jewish ancestry. There is no known consanguinity.  GENETIC COUNSELING ASSESSMENT: Karina Barnett is a 50 y.o. female with a family history of early-onset breast and pancreatic cancer which is somewhat suggestive of a hereditary cancer syndrome and predisposition to cancer. We, therefore, discussed and recommended the following at today's visit.   DISCUSSION: We reviewed the characteristics, features and inheritance patterns of hereditary cancer syndromes, particularly those caused by mutations within the BRCA1/2 genes. We also discussed genetic testing, including the appropriate family members to test, the process of testing, insurance coverage and turn-around-time for results. We discussed the implications of a negative, positive and/or variant of uncertain  significant result. We recommended [name] pursue genetic testing for the 42-gene [facility] Common Hereditary Cancers Panel (Breast, Gyn, GI).  The 42-gene [facility] Common Hereditary Cancers Panel (Breast, Gyn, GI) performed by [facility], [place]) includes sequencing and/or deletion/duplication analysis for the following genes: APC, ATM, AXIN2, BARD1, BMPR1A, BRCA1, BRCA2, BRIP1, CDH1, CDKN2A, CHEK2, DICER1, EPCAM, GREM1, KIT, MEN1, MLH1, MSH2, MSH6, MUTYH, NBN, NF1, PALB2, PDGFRA, PMS2, POLD1, POLE, PTEN, RAD50, RAD51C, RAD51D, SDHA, SDHB, SDHC, SDHD, SMAD4, SMARCA4, STK11, TP53, TSC1, TSC2, and VHL.  Based on Ms. Osborne's family history of cancer, she meets medical criteria for genetic testing. Despite that she meets criteria, she may still have an out of pocket cost. We discussed that if her out of pocket cost for testing is over $100, the laboratory will  call and confirm whether she wants to proceed with testing.  If the out of pocket cost of testing is less than $100 she will be billed by the genetic testing laboratory.   PLAN: After considering the risks, benefits, and limitations, [name]  provided informed consent to pursue genetic testing and the blood sample was sent to [facility] for analysis of the 42-gene [facility] Common Hereditary Cancers Panel. Results should be available within approximately 2-3 weeks' time, at which point they will be disclosed by telephone to [name], as will any additional recommendations warranted by these results. [name] will receive a summary of her genetic counseling visit and a copy of her results once available. This information will also be available in Epic. We encouraged [name] to remain in contact with cancer genetics annually so that we can continuously update the family history and inform her of any changes in cancer genetics and testing that may be of benefit for her family. [name] questions were answered to  her satisfaction today. Our contact information was provided should additional questions or concerns arise.  Thank you for the referral and allowing [place] to share in the care of your patient.   Jeanine Luz, MS, [facility] Certified Genetic Counselor [email or web address] Phone: [phone]  The patient was seen for a total of 60 minutes in face-to-face genetic counseling.  This patient was discussed with Drs. Magrinat, Lindi Adie and/or Burr Medico who agrees with the above.    _______________________________________________________________________ For Office Staff:  Number of people involved in session: 1 Was an Intern/ student involved with case: no

## 2016-08-16 DIAGNOSIS — D2262 Melanocytic nevi of left upper limb, including shoulder: Secondary | ICD-10-CM | POA: Diagnosis not present

## 2016-08-16 DIAGNOSIS — D2271 Melanocytic nevi of right lower limb, including hip: Secondary | ICD-10-CM | POA: Diagnosis not present

## 2016-08-16 DIAGNOSIS — L821 Other seborrheic keratosis: Secondary | ICD-10-CM | POA: Diagnosis not present

## 2016-08-16 DIAGNOSIS — D225 Melanocytic nevi of trunk: Secondary | ICD-10-CM | POA: Diagnosis not present

## 2016-08-16 DIAGNOSIS — D224 Melanocytic nevi of scalp and neck: Secondary | ICD-10-CM | POA: Diagnosis not present

## 2016-08-16 DIAGNOSIS — D1801 Hemangioma of skin and subcutaneous tissue: Secondary | ICD-10-CM | POA: Diagnosis not present

## 2016-08-16 DIAGNOSIS — D2261 Melanocytic nevi of right upper limb, including shoulder: Secondary | ICD-10-CM | POA: Diagnosis not present

## 2016-08-16 DIAGNOSIS — D2272 Melanocytic nevi of left lower limb, including hip: Secondary | ICD-10-CM | POA: Diagnosis not present

## 2016-08-16 DIAGNOSIS — L814 Other melanin hyperpigmentation: Secondary | ICD-10-CM | POA: Diagnosis not present

## 2016-08-17 ENCOUNTER — Telehealth: Payer: Self-pay | Admitting: Genetic Counselor

## 2016-08-22 ENCOUNTER — Encounter: Payer: Self-pay | Admitting: Genetic Counselor

## 2016-08-22 DIAGNOSIS — Z1379 Encounter for other screening for genetic and chromosomal anomalies: Secondary | ICD-10-CM

## 2016-08-22 HISTORY — DX: Encounter for other screening for genetic and chromosomal anomalies: Z13.79

## 2016-08-22 NOTE — Telephone Encounter (Signed)
Revealed negative genetic testing on the Common hereditary cancer panel.  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  This would not change her medical management.  Patient voiced her understanding.  I released a copy of the results to the patient. 

## 2016-08-24 ENCOUNTER — Ambulatory Visit: Payer: Self-pay | Admitting: Genetic Counselor

## 2016-08-24 DIAGNOSIS — Z803 Family history of malignant neoplasm of breast: Secondary | ICD-10-CM

## 2016-08-24 DIAGNOSIS — Z1379 Encounter for other screening for genetic and chromosomal anomalies: Secondary | ICD-10-CM

## 2016-08-24 NOTE — Progress Notes (Signed)
HPI: Karina Barnett was previously seen in the [facility] clinic due to a family history of cancer and concerns regarding a hereditary predisposition to cancer. Please refer to our prior cancer genetics clinic note for more information regarding Karina Barnett's medical, social and family histories, and our assessment and recommendations, at the time. Karina Barnett recent genetic test results were disclosed to her, as were recommendations warranted by these results. These results and recommendations are discussed in more detail below.  CANCER HISTORY:   No history exists.    FAMILY HISTORY:  We obtained a detailed, 4-generation family history.  Significant diagnoses are listed below: Family History  Problem Relation Age of Onset  . Hypertension Mother   . Irritable bowel syndrome Mother   . Other Mother     hx of hysterectomy in her late 13s-early 60s for prolapsed uterus  . Arthritis Maternal Grandmother   . Alzheimer's disease Maternal Grandmother     d. 38y  . Hypertension Maternal Grandfather   . Heart disease Maternal Grandfather   . Heart Problems Maternal Grandfather     open heart surgery  . Stroke Maternal Grandfather     d. 93y  . Skin cancer Maternal Grandfather     hx of skin burn; unspecified type  . Alzheimer's disease Paternal Grandmother   . Diabetes Paternal Grandmother     borderline  . Breast cancer Sister 34    DCIS  . Other Sister     dx benign grape-sized tumor of abdomen  . Lung cancer Paternal Grandfather     d. 75; tumor of bronchial arch; hx of chemical inhalation - worked in [employer]  . Other Other     icthyosis  . Breast cancer Cousin 31    paternal 1st cousin   . Pancreatic cancer Cousin 13    paternal 1st cousin d. 40y; smoker    Karina Barnett has three daughters, ages 76-25.  She has one full brother and sister, ages 63 and 73.  Her sister was diagnosed with DCIS breast cancer at 62 and also has history of a benign, grapefruit-sized  tumor of her abdomen.  Her sister has a daughter who has a hereditary icthyosis skin condition.  Karina Barnett brother has never had cancer.    Karina Barnett mother is currently 37 and has not had cancer.  Her mother underwent a hysterectomy in her late 4s-early 60s for prolapsed uterus.  Her mother has one full sister who is currently 74 and has not had cancer.  Karina Barnett maternal grandmother passed away from Alzheimer's-related illness at age 86.  Her grandfather passed away from a stroke following open heart surgery at age 75.  Her grandfather has a history of skin cancer.  Karina Barnett reports no known cancer for her maternal great aunts/uncles and great grandparents.    Karina Barnett father is currently 64 and has not had cancer.  Her father has two full sisters, ages 59 and 71.  Karina Barnett is unaware of a history of cancer for her aunts.  One paternal first cousin was diagnosed with breast cancer at 91; another paternal first cousin was diagnosed with pancreatic cancer at 58.  Karina Barnett paternal grandmother passed away from alzheimer's-related illness.  Her grandfather passed away from bronchial cancer at 43.  He had a history of chemical inhalation through his history of working at a [employer].  Karina Barnett is unaware of any cancer history for other paternal great aunts/uncles and great grandparents.  Patient's maternal ancestors are of [place] descent, and paternal ancestors are of [place] and [place] descent. There is no reported Ashkenazi Jewish ancestry. There is no known consanguinity.  GENETIC TEST RESULTS: Genetic testing reported out on August 15, 2016 through the Common Hereditary cancer panel found no deleterious mutations.  The Hereditary Gene Panel offered by [facility] includes sequencing and/or deletion duplication testing of the following 43 genes: APC, ATM, AXIN2, BARD1, BMPR1A, BRCA1, BRCA2, BRIP1, CDH1, CDKN2A (p14ARF), CDKN2A (p16INK4a), CHEK2, DICER1, EPCAM  (Deletion/duplication testing only), GREM1 (promoter region deletion/duplication testing only), KIT, MEN1, MLH1, MSH2, MSH6, MUTYH, NBN, NF1, PALB2, PDGFRA, PMS2, POLD1, POLE, PTEN, RAD50, RAD51C, RAD51D, SDHB, SDHC, SDHD, SMAD4, SMARCA4. STK11, TP53, TSC1, TSC2, and VHL.  The following gene was evaluated for sequence changes only: SDHA and HOXB13 c.251G>A variant only.  The test report has been scanned into EPIC and is located under the Molecular Pathology section of the Results Review tab.   We discussed with Karina Barnett that since the current genetic testing is not perfect, it is possible there may be a gene mutation in one of these genes that current testing cannot detect, but that chance is small. We also discussed, that it is possible that another gene that has not yet been discovered, or that we have not yet tested, is responsible for the cancer diagnoses in the family, and it is, therefore, important to remain in touch with cancer genetics in the future so that we can continue to offer Karina Barnett the most up to date genetic testing.   Genetic testing did detect a Variant of Unknown Significance in the ATM gene called c.1271C>A. At this time, it is unknown if this variant is associated with increased cancer risk or if this is a normal finding, but most variants such as this get reclassified to being inconsequential. It should not be used to make medical management decisions. With time, we suspect the lab will determine the significance of this variant, if any. If we do learn more about it, we will try to contact Karina Barnett to discuss it further. However, it is important to stay in touch with [place] periodically and keep the address and phone number up to date.   CANCER SCREENING RECOMMENDATIONS:  This normal result is reassuring and indicates that Karina Barnett does not likely have an increased risk of cancer due to a mutation in one of these genes.  We, therefore, recommended  Karina Barnett continue to  follow the cancer screening guidelines provided by her primary healthcare providers.   RECOMMENDATIONS FOR FAMILY MEMBERS: Women in this family might be at some increased risk of developing cancer, over the general population risk, simply due to the family history of cancer. We recommended women in this family have a yearly mammogram beginning at age 57, or 41 years younger than the earliest onset of cancer, an annual clinical breast exam, and perform monthly breast self-exams. Women in this family should also have a gynecological exam as recommended by their primary provider. All family members should have a colonoscopy by age 33.  Based on Karina Barnett's family history, we recommended her sister, who was diagnosed with breast cancer at age 85, have genetic counseling and testing. Karina Barnett will let us know if we can be of any assistance in coordinating genetic counseling and/or testing for this family member.   FOLLOW-UP: Lastly, we discussed with Karina Barnett that cancer genetics is a rapidly advancing field and it is possible that new genetic tests will be  appropriate for her and/or her family members in the future. We encouraged her to remain in contact with cancer genetics on an annual basis so we can update her personal and family histories and let her know of advances in cancer genetics that may benefit this family.   Our contact number was provided. Karina Barnett questions were answered to her satisfaction, and she knows she is welcome to call us at anytime with additional questions or concerns.   Roma Kayser, MS, [facility] Certified Genetic Counselor [email or web address]

## 2016-11-13 DIAGNOSIS — K219 Gastro-esophageal reflux disease without esophagitis: Secondary | ICD-10-CM | POA: Diagnosis not present

## 2016-11-13 DIAGNOSIS — R0789 Other chest pain: Secondary | ICD-10-CM | POA: Diagnosis not present

## 2016-11-13 DIAGNOSIS — E7800 Pure hypercholesterolemia, unspecified: Secondary | ICD-10-CM | POA: Diagnosis not present

## 2017-01-10 DIAGNOSIS — K219 Gastro-esophageal reflux disease without esophagitis: Secondary | ICD-10-CM | POA: Diagnosis not present

## 2017-01-10 DIAGNOSIS — G4489 Other headache syndrome: Secondary | ICD-10-CM | POA: Diagnosis not present

## 2017-01-10 DIAGNOSIS — R21 Rash and other nonspecific skin eruption: Secondary | ICD-10-CM | POA: Diagnosis not present

## 2017-01-10 DIAGNOSIS — R590 Localized enlarged lymph nodes: Secondary | ICD-10-CM | POA: Diagnosis not present

## 2017-06-03 DIAGNOSIS — E559 Vitamin D deficiency, unspecified: Secondary | ICD-10-CM | POA: Diagnosis not present

## 2017-06-03 DIAGNOSIS — Z6828 Body mass index (BMI) 28.0-28.9, adult: Secondary | ICD-10-CM | POA: Diagnosis not present

## 2017-06-03 DIAGNOSIS — E7800 Pure hypercholesterolemia, unspecified: Secondary | ICD-10-CM | POA: Diagnosis not present

## 2017-06-03 DIAGNOSIS — J381 Polyp of vocal cord and larynx: Secondary | ICD-10-CM | POA: Diagnosis not present

## 2017-06-03 DIAGNOSIS — Z01419 Encounter for gynecological examination (general) (routine) without abnormal findings: Secondary | ICD-10-CM | POA: Diagnosis not present

## 2017-06-03 DIAGNOSIS — Z0000 Encounter for general adult medical examination without abnormal findings: Secondary | ICD-10-CM | POA: Diagnosis not present

## 2017-06-12 ENCOUNTER — Other Ambulatory Visit: Payer: Self-pay | Admitting: Internal Medicine

## 2017-06-12 DIAGNOSIS — Z1231 Encounter for screening mammogram for malignant neoplasm of breast: Secondary | ICD-10-CM

## 2017-07-01 ENCOUNTER — Ambulatory Visit: Payer: 59

## 2017-07-01 ENCOUNTER — Ambulatory Visit
Admission: RE | Admit: 2017-07-01 | Discharge: 2017-07-01 | Disposition: A | Discharge status: Home / Self Care | Payer: BLUE CROSS/BLUE SHIELD | Source: Ambulatory Visit | Attending: Internal Medicine | Admitting: Internal Medicine

## 2017-07-01 DIAGNOSIS — Z1231 Encounter for screening mammogram for malignant neoplasm of breast: Secondary | ICD-10-CM

## 2017-07-16 DIAGNOSIS — D126 Benign neoplasm of colon, unspecified: Secondary | ICD-10-CM | POA: Diagnosis not present

## 2017-07-16 DIAGNOSIS — Z1211 Encounter for screening for malignant neoplasm of colon: Secondary | ICD-10-CM | POA: Diagnosis not present

## 2017-07-23 DIAGNOSIS — Z1211 Encounter for screening for malignant neoplasm of colon: Secondary | ICD-10-CM | POA: Diagnosis not present

## 2017-07-23 DIAGNOSIS — D126 Benign neoplasm of colon, unspecified: Secondary | ICD-10-CM | POA: Diagnosis not present

## 2017-08-12 DIAGNOSIS — D224 Melanocytic nevi of scalp and neck: Secondary | ICD-10-CM | POA: Diagnosis not present

## 2017-08-12 DIAGNOSIS — D1801 Hemangioma of skin and subcutaneous tissue: Secondary | ICD-10-CM | POA: Diagnosis not present

## 2017-08-12 DIAGNOSIS — C44519 Basal cell carcinoma of skin of other part of trunk: Secondary | ICD-10-CM | POA: Diagnosis not present

## 2017-08-12 DIAGNOSIS — D2271 Melanocytic nevi of right lower limb, including hip: Secondary | ICD-10-CM | POA: Diagnosis not present

## 2017-08-12 DIAGNOSIS — D2262 Melanocytic nevi of left upper limb, including shoulder: Secondary | ICD-10-CM | POA: Diagnosis not present

## 2017-08-29 DIAGNOSIS — F4322 Adjustment disorder with anxiety: Secondary | ICD-10-CM | POA: Diagnosis not present

## 2017-09-03 DIAGNOSIS — M9902 Segmental and somatic dysfunction of thoracic region: Secondary | ICD-10-CM | POA: Diagnosis not present

## 2017-09-03 DIAGNOSIS — M722 Plantar fascial fibromatosis: Secondary | ICD-10-CM | POA: Diagnosis not present

## 2017-09-03 DIAGNOSIS — M791 Myalgia, unspecified site: Secondary | ICD-10-CM | POA: Diagnosis not present

## 2017-09-03 DIAGNOSIS — M9903 Segmental and somatic dysfunction of lumbar region: Secondary | ICD-10-CM | POA: Diagnosis not present

## 2017-09-03 DIAGNOSIS — M9901 Segmental and somatic dysfunction of cervical region: Secondary | ICD-10-CM | POA: Diagnosis not present

## 2017-09-03 DIAGNOSIS — M9905 Segmental and somatic dysfunction of pelvic region: Secondary | ICD-10-CM | POA: Diagnosis not present

## 2017-09-04 DIAGNOSIS — F4322 Adjustment disorder with anxiety: Secondary | ICD-10-CM | POA: Diagnosis not present

## 2017-09-05 DIAGNOSIS — M9905 Segmental and somatic dysfunction of pelvic region: Secondary | ICD-10-CM | POA: Diagnosis not present

## 2017-09-05 DIAGNOSIS — M9901 Segmental and somatic dysfunction of cervical region: Secondary | ICD-10-CM | POA: Diagnosis not present

## 2017-09-05 DIAGNOSIS — M9903 Segmental and somatic dysfunction of lumbar region: Secondary | ICD-10-CM | POA: Diagnosis not present

## 2017-09-05 DIAGNOSIS — M9902 Segmental and somatic dysfunction of thoracic region: Secondary | ICD-10-CM | POA: Diagnosis not present

## 2017-09-05 DIAGNOSIS — M791 Myalgia, unspecified site: Secondary | ICD-10-CM | POA: Diagnosis not present

## 2017-09-05 DIAGNOSIS — M722 Plantar fascial fibromatosis: Secondary | ICD-10-CM | POA: Diagnosis not present

## 2017-09-13 DIAGNOSIS — M9903 Segmental and somatic dysfunction of lumbar region: Secondary | ICD-10-CM | POA: Diagnosis not present

## 2017-09-13 DIAGNOSIS — M722 Plantar fascial fibromatosis: Secondary | ICD-10-CM | POA: Diagnosis not present

## 2017-09-13 DIAGNOSIS — M9901 Segmental and somatic dysfunction of cervical region: Secondary | ICD-10-CM | POA: Diagnosis not present

## 2017-09-13 DIAGNOSIS — M791 Myalgia, unspecified site: Secondary | ICD-10-CM | POA: Diagnosis not present

## 2017-09-13 DIAGNOSIS — M9905 Segmental and somatic dysfunction of pelvic region: Secondary | ICD-10-CM | POA: Diagnosis not present

## 2017-09-13 DIAGNOSIS — M9902 Segmental and somatic dysfunction of thoracic region: Secondary | ICD-10-CM | POA: Diagnosis not present

## 2017-09-17 DIAGNOSIS — M722 Plantar fascial fibromatosis: Secondary | ICD-10-CM | POA: Diagnosis not present

## 2017-09-17 DIAGNOSIS — M9903 Segmental and somatic dysfunction of lumbar region: Secondary | ICD-10-CM | POA: Diagnosis not present

## 2017-09-17 DIAGNOSIS — M9902 Segmental and somatic dysfunction of thoracic region: Secondary | ICD-10-CM | POA: Diagnosis not present

## 2017-09-17 DIAGNOSIS — M9905 Segmental and somatic dysfunction of pelvic region: Secondary | ICD-10-CM | POA: Diagnosis not present

## 2017-09-17 DIAGNOSIS — M791 Myalgia, unspecified site: Secondary | ICD-10-CM | POA: Diagnosis not present

## 2017-09-17 DIAGNOSIS — M9901 Segmental and somatic dysfunction of cervical region: Secondary | ICD-10-CM | POA: Diagnosis not present

## 2017-09-17 DIAGNOSIS — F4322 Adjustment disorder with anxiety: Secondary | ICD-10-CM | POA: Diagnosis not present

## 2017-09-20 DIAGNOSIS — M9902 Segmental and somatic dysfunction of thoracic region: Secondary | ICD-10-CM | POA: Diagnosis not present

## 2017-09-20 DIAGNOSIS — M9905 Segmental and somatic dysfunction of pelvic region: Secondary | ICD-10-CM | POA: Diagnosis not present

## 2017-09-20 DIAGNOSIS — M722 Plantar fascial fibromatosis: Secondary | ICD-10-CM | POA: Diagnosis not present

## 2017-09-20 DIAGNOSIS — M9901 Segmental and somatic dysfunction of cervical region: Secondary | ICD-10-CM | POA: Diagnosis not present

## 2017-09-20 DIAGNOSIS — M791 Myalgia, unspecified site: Secondary | ICD-10-CM | POA: Diagnosis not present

## 2017-09-20 DIAGNOSIS — M9903 Segmental and somatic dysfunction of lumbar region: Secondary | ICD-10-CM | POA: Diagnosis not present

## 2017-09-25 DIAGNOSIS — F4322 Adjustment disorder with anxiety: Secondary | ICD-10-CM | POA: Diagnosis not present

## 2017-09-27 DIAGNOSIS — M722 Plantar fascial fibromatosis: Secondary | ICD-10-CM | POA: Diagnosis not present

## 2017-09-27 DIAGNOSIS — M9903 Segmental and somatic dysfunction of lumbar region: Secondary | ICD-10-CM | POA: Diagnosis not present

## 2017-09-27 DIAGNOSIS — M9901 Segmental and somatic dysfunction of cervical region: Secondary | ICD-10-CM | POA: Diagnosis not present

## 2017-09-27 DIAGNOSIS — M9902 Segmental and somatic dysfunction of thoracic region: Secondary | ICD-10-CM | POA: Diagnosis not present

## 2017-09-27 DIAGNOSIS — M9905 Segmental and somatic dysfunction of pelvic region: Secondary | ICD-10-CM | POA: Diagnosis not present

## 2017-09-27 DIAGNOSIS — M791 Myalgia, unspecified site: Secondary | ICD-10-CM | POA: Diagnosis not present

## 2017-10-04 DIAGNOSIS — M9903 Segmental and somatic dysfunction of lumbar region: Secondary | ICD-10-CM | POA: Diagnosis not present

## 2017-10-04 DIAGNOSIS — M9902 Segmental and somatic dysfunction of thoracic region: Secondary | ICD-10-CM | POA: Diagnosis not present

## 2017-10-04 DIAGNOSIS — M9901 Segmental and somatic dysfunction of cervical region: Secondary | ICD-10-CM | POA: Diagnosis not present

## 2017-10-04 DIAGNOSIS — M9905 Segmental and somatic dysfunction of pelvic region: Secondary | ICD-10-CM | POA: Diagnosis not present

## 2017-10-04 DIAGNOSIS — M722 Plantar fascial fibromatosis: Secondary | ICD-10-CM | POA: Diagnosis not present

## 2017-10-04 DIAGNOSIS — M791 Myalgia, unspecified site: Secondary | ICD-10-CM | POA: Diagnosis not present

## 2017-10-09 DIAGNOSIS — M9902 Segmental and somatic dysfunction of thoracic region: Secondary | ICD-10-CM | POA: Diagnosis not present

## 2017-10-09 DIAGNOSIS — F4322 Adjustment disorder with anxiety: Secondary | ICD-10-CM | POA: Diagnosis not present

## 2017-10-09 DIAGNOSIS — M791 Myalgia, unspecified site: Secondary | ICD-10-CM | POA: Diagnosis not present

## 2017-10-09 DIAGNOSIS — M9901 Segmental and somatic dysfunction of cervical region: Secondary | ICD-10-CM | POA: Diagnosis not present

## 2017-10-09 DIAGNOSIS — M9903 Segmental and somatic dysfunction of lumbar region: Secondary | ICD-10-CM | POA: Diagnosis not present

## 2017-10-09 DIAGNOSIS — M722 Plantar fascial fibromatosis: Secondary | ICD-10-CM | POA: Diagnosis not present

## 2017-10-09 DIAGNOSIS — M9905 Segmental and somatic dysfunction of pelvic region: Secondary | ICD-10-CM | POA: Diagnosis not present

## 2017-10-14 DIAGNOSIS — M722 Plantar fascial fibromatosis: Secondary | ICD-10-CM | POA: Diagnosis not present

## 2017-10-14 DIAGNOSIS — M9902 Segmental and somatic dysfunction of thoracic region: Secondary | ICD-10-CM | POA: Diagnosis not present

## 2017-10-14 DIAGNOSIS — M9901 Segmental and somatic dysfunction of cervical region: Secondary | ICD-10-CM | POA: Diagnosis not present

## 2017-10-14 DIAGNOSIS — M9905 Segmental and somatic dysfunction of pelvic region: Secondary | ICD-10-CM | POA: Diagnosis not present

## 2017-10-14 DIAGNOSIS — M9903 Segmental and somatic dysfunction of lumbar region: Secondary | ICD-10-CM | POA: Diagnosis not present

## 2017-10-14 DIAGNOSIS — M791 Myalgia, unspecified site: Secondary | ICD-10-CM | POA: Diagnosis not present

## 2017-10-17 DIAGNOSIS — F4322 Adjustment disorder with anxiety: Secondary | ICD-10-CM | POA: Diagnosis not present

## 2017-10-31 DIAGNOSIS — F4322 Adjustment disorder with anxiety: Secondary | ICD-10-CM | POA: Diagnosis not present

## 2017-11-12 DIAGNOSIS — F4322 Adjustment disorder with anxiety: Secondary | ICD-10-CM | POA: Diagnosis not present

## 2017-11-13 DIAGNOSIS — M9902 Segmental and somatic dysfunction of thoracic region: Secondary | ICD-10-CM | POA: Diagnosis not present

## 2017-11-13 DIAGNOSIS — M9905 Segmental and somatic dysfunction of pelvic region: Secondary | ICD-10-CM | POA: Diagnosis not present

## 2017-11-13 DIAGNOSIS — M791 Myalgia, unspecified site: Secondary | ICD-10-CM | POA: Diagnosis not present

## 2017-11-13 DIAGNOSIS — M722 Plantar fascial fibromatosis: Secondary | ICD-10-CM | POA: Diagnosis not present

## 2017-11-13 DIAGNOSIS — M9903 Segmental and somatic dysfunction of lumbar region: Secondary | ICD-10-CM | POA: Diagnosis not present

## 2017-11-13 DIAGNOSIS — M9901 Segmental and somatic dysfunction of cervical region: Secondary | ICD-10-CM | POA: Diagnosis not present

## 2017-11-21 DIAGNOSIS — F4322 Adjustment disorder with anxiety: Secondary | ICD-10-CM | POA: Diagnosis not present

## 2017-12-03 DIAGNOSIS — F4322 Adjustment disorder with anxiety: Secondary | ICD-10-CM | POA: Diagnosis not present

## 2017-12-06 DIAGNOSIS — M9902 Segmental and somatic dysfunction of thoracic region: Secondary | ICD-10-CM | POA: Diagnosis not present

## 2017-12-06 DIAGNOSIS — M722 Plantar fascial fibromatosis: Secondary | ICD-10-CM | POA: Diagnosis not present

## 2017-12-06 DIAGNOSIS — M9905 Segmental and somatic dysfunction of pelvic region: Secondary | ICD-10-CM | POA: Diagnosis not present

## 2017-12-06 DIAGNOSIS — M9901 Segmental and somatic dysfunction of cervical region: Secondary | ICD-10-CM | POA: Diagnosis not present

## 2017-12-06 DIAGNOSIS — M9903 Segmental and somatic dysfunction of lumbar region: Secondary | ICD-10-CM | POA: Diagnosis not present

## 2017-12-06 DIAGNOSIS — M791 Myalgia, unspecified site: Secondary | ICD-10-CM | POA: Diagnosis not present

## 2017-12-11 DIAGNOSIS — M9903 Segmental and somatic dysfunction of lumbar region: Secondary | ICD-10-CM | POA: Diagnosis not present

## 2017-12-11 DIAGNOSIS — M9905 Segmental and somatic dysfunction of pelvic region: Secondary | ICD-10-CM | POA: Diagnosis not present

## 2017-12-11 DIAGNOSIS — M9901 Segmental and somatic dysfunction of cervical region: Secondary | ICD-10-CM | POA: Diagnosis not present

## 2017-12-11 DIAGNOSIS — M9902 Segmental and somatic dysfunction of thoracic region: Secondary | ICD-10-CM | POA: Diagnosis not present

## 2017-12-11 DIAGNOSIS — M722 Plantar fascial fibromatosis: Secondary | ICD-10-CM | POA: Diagnosis not present

## 2017-12-11 DIAGNOSIS — Z630 Problems in relationship with spouse or partner: Secondary | ICD-10-CM | POA: Diagnosis not present

## 2017-12-11 DIAGNOSIS — E7800 Pure hypercholesterolemia, unspecified: Secondary | ICD-10-CM | POA: Diagnosis not present

## 2017-12-11 DIAGNOSIS — M791 Myalgia, unspecified site: Secondary | ICD-10-CM | POA: Diagnosis not present

## 2017-12-11 DIAGNOSIS — F413 Other mixed anxiety disorders: Secondary | ICD-10-CM | POA: Diagnosis not present

## 2017-12-12 DIAGNOSIS — F4322 Adjustment disorder with anxiety: Secondary | ICD-10-CM | POA: Diagnosis not present

## 2017-12-26 DIAGNOSIS — F4322 Adjustment disorder with anxiety: Secondary | ICD-10-CM | POA: Diagnosis not present

## 2018-01-03 DIAGNOSIS — M791 Myalgia, unspecified site: Secondary | ICD-10-CM | POA: Diagnosis not present

## 2018-01-03 DIAGNOSIS — M9901 Segmental and somatic dysfunction of cervical region: Secondary | ICD-10-CM | POA: Diagnosis not present

## 2018-01-03 DIAGNOSIS — M9905 Segmental and somatic dysfunction of pelvic region: Secondary | ICD-10-CM | POA: Diagnosis not present

## 2018-01-03 DIAGNOSIS — M9903 Segmental and somatic dysfunction of lumbar region: Secondary | ICD-10-CM | POA: Diagnosis not present

## 2018-01-03 DIAGNOSIS — M722 Plantar fascial fibromatosis: Secondary | ICD-10-CM | POA: Diagnosis not present

## 2018-01-03 DIAGNOSIS — M9902 Segmental and somatic dysfunction of thoracic region: Secondary | ICD-10-CM | POA: Diagnosis not present

## 2018-01-14 DIAGNOSIS — F4322 Adjustment disorder with anxiety: Secondary | ICD-10-CM | POA: Diagnosis not present

## 2018-01-27 DIAGNOSIS — M9905 Segmental and somatic dysfunction of pelvic region: Secondary | ICD-10-CM | POA: Diagnosis not present

## 2018-01-27 DIAGNOSIS — M722 Plantar fascial fibromatosis: Secondary | ICD-10-CM | POA: Diagnosis not present

## 2018-01-27 DIAGNOSIS — M9903 Segmental and somatic dysfunction of lumbar region: Secondary | ICD-10-CM | POA: Diagnosis not present

## 2018-01-27 DIAGNOSIS — M9901 Segmental and somatic dysfunction of cervical region: Secondary | ICD-10-CM | POA: Diagnosis not present

## 2018-01-27 DIAGNOSIS — M791 Myalgia, unspecified site: Secondary | ICD-10-CM | POA: Diagnosis not present

## 2018-01-27 DIAGNOSIS — M9902 Segmental and somatic dysfunction of thoracic region: Secondary | ICD-10-CM | POA: Diagnosis not present

## 2018-02-04 DIAGNOSIS — F4322 Adjustment disorder with anxiety: Secondary | ICD-10-CM | POA: Diagnosis not present

## 2018-02-05 DIAGNOSIS — M722 Plantar fascial fibromatosis: Secondary | ICD-10-CM | POA: Diagnosis not present

## 2018-02-05 DIAGNOSIS — M791 Myalgia, unspecified site: Secondary | ICD-10-CM | POA: Diagnosis not present

## 2018-02-05 DIAGNOSIS — M9903 Segmental and somatic dysfunction of lumbar region: Secondary | ICD-10-CM | POA: Diagnosis not present

## 2018-02-05 DIAGNOSIS — M9902 Segmental and somatic dysfunction of thoracic region: Secondary | ICD-10-CM | POA: Diagnosis not present

## 2018-02-05 DIAGNOSIS — M9901 Segmental and somatic dysfunction of cervical region: Secondary | ICD-10-CM | POA: Diagnosis not present

## 2018-02-05 DIAGNOSIS — M9905 Segmental and somatic dysfunction of pelvic region: Secondary | ICD-10-CM | POA: Diagnosis not present

## 2018-02-21 DIAGNOSIS — M722 Plantar fascial fibromatosis: Secondary | ICD-10-CM | POA: Diagnosis not present

## 2018-02-21 DIAGNOSIS — M9902 Segmental and somatic dysfunction of thoracic region: Secondary | ICD-10-CM | POA: Diagnosis not present

## 2018-02-21 DIAGNOSIS — M9903 Segmental and somatic dysfunction of lumbar region: Secondary | ICD-10-CM | POA: Diagnosis not present

## 2018-02-21 DIAGNOSIS — M791 Myalgia, unspecified site: Secondary | ICD-10-CM | POA: Diagnosis not present

## 2018-02-21 DIAGNOSIS — M9901 Segmental and somatic dysfunction of cervical region: Secondary | ICD-10-CM | POA: Diagnosis not present

## 2018-02-21 DIAGNOSIS — M9905 Segmental and somatic dysfunction of pelvic region: Secondary | ICD-10-CM | POA: Diagnosis not present

## 2018-03-04 DIAGNOSIS — F4322 Adjustment disorder with anxiety: Secondary | ICD-10-CM | POA: Diagnosis not present

## 2018-03-10 DIAGNOSIS — M9902 Segmental and somatic dysfunction of thoracic region: Secondary | ICD-10-CM | POA: Diagnosis not present

## 2018-03-10 DIAGNOSIS — M9903 Segmental and somatic dysfunction of lumbar region: Secondary | ICD-10-CM | POA: Diagnosis not present

## 2018-03-10 DIAGNOSIS — M9901 Segmental and somatic dysfunction of cervical region: Secondary | ICD-10-CM | POA: Diagnosis not present

## 2018-03-10 DIAGNOSIS — M9905 Segmental and somatic dysfunction of pelvic region: Secondary | ICD-10-CM | POA: Diagnosis not present

## 2018-04-01 DIAGNOSIS — F4322 Adjustment disorder with anxiety: Secondary | ICD-10-CM | POA: Diagnosis not present

## 2018-04-21 DIAGNOSIS — M9902 Segmental and somatic dysfunction of thoracic region: Secondary | ICD-10-CM | POA: Diagnosis not present

## 2018-04-21 DIAGNOSIS — M9903 Segmental and somatic dysfunction of lumbar region: Secondary | ICD-10-CM | POA: Diagnosis not present

## 2018-04-21 DIAGNOSIS — M9901 Segmental and somatic dysfunction of cervical region: Secondary | ICD-10-CM | POA: Diagnosis not present

## 2018-04-21 DIAGNOSIS — M9905 Segmental and somatic dysfunction of pelvic region: Secondary | ICD-10-CM | POA: Diagnosis not present

## 2018-04-24 DIAGNOSIS — M9902 Segmental and somatic dysfunction of thoracic region: Secondary | ICD-10-CM | POA: Diagnosis not present

## 2018-04-24 DIAGNOSIS — M9901 Segmental and somatic dysfunction of cervical region: Secondary | ICD-10-CM | POA: Diagnosis not present

## 2018-04-24 DIAGNOSIS — M9905 Segmental and somatic dysfunction of pelvic region: Secondary | ICD-10-CM | POA: Diagnosis not present

## 2018-04-24 DIAGNOSIS — M9903 Segmental and somatic dysfunction of lumbar region: Secondary | ICD-10-CM | POA: Diagnosis not present

## 2018-05-20 DIAGNOSIS — F4322 Adjustment disorder with anxiety: Secondary | ICD-10-CM | POA: Diagnosis not present

## 2018-06-05 DIAGNOSIS — K219 Gastro-esophageal reflux disease without esophagitis: Secondary | ICD-10-CM | POA: Diagnosis not present

## 2018-06-05 DIAGNOSIS — E559 Vitamin D deficiency, unspecified: Secondary | ICD-10-CM | POA: Diagnosis not present

## 2018-06-05 DIAGNOSIS — E7800 Pure hypercholesterolemia, unspecified: Secondary | ICD-10-CM | POA: Diagnosis not present

## 2018-06-05 DIAGNOSIS — Z23 Encounter for immunization: Secondary | ICD-10-CM | POA: Diagnosis not present

## 2018-06-05 DIAGNOSIS — Z0000 Encounter for general adult medical examination without abnormal findings: Secondary | ICD-10-CM | POA: Diagnosis not present

## 2018-06-12 DIAGNOSIS — Z13820 Encounter for screening for osteoporosis: Secondary | ICD-10-CM | POA: Diagnosis not present

## 2018-06-12 DIAGNOSIS — Z01419 Encounter for gynecological examination (general) (routine) without abnormal findings: Secondary | ICD-10-CM | POA: Diagnosis not present

## 2018-06-12 DIAGNOSIS — Z6831 Body mass index (BMI) 31.0-31.9, adult: Secondary | ICD-10-CM | POA: Diagnosis not present

## 2018-06-16 ENCOUNTER — Other Ambulatory Visit: Payer: Self-pay | Admitting: Internal Medicine

## 2018-06-16 DIAGNOSIS — Z1231 Encounter for screening mammogram for malignant neoplasm of breast: Secondary | ICD-10-CM

## 2018-07-01 DIAGNOSIS — F4322 Adjustment disorder with anxiety: Secondary | ICD-10-CM | POA: Diagnosis not present

## 2018-07-22 ENCOUNTER — Ambulatory Visit
Admission: RE | Admit: 2018-07-22 | Discharge: 2018-07-22 | Disposition: A | Discharge status: Home / Self Care | Payer: BLUE CROSS/BLUE SHIELD | Source: Ambulatory Visit | Attending: Internal Medicine | Admitting: Internal Medicine

## 2018-07-22 DIAGNOSIS — Z1231 Encounter for screening mammogram for malignant neoplasm of breast: Secondary | ICD-10-CM | POA: Diagnosis not present

## 2018-07-23 NOTE — H&P (Signed)
NAME: Karina Barnett, Karina Barnett MEDICAL RECORD [id number] ACCOUNT [id number] DATE OF BIRTH:October 02, 1965 FACILITY: [facility] LOCATION:  PHYSICIAN:Kendel Bessey Sherran Needs, MD  HISTORY AND PHYSICAL  DATE OF ADMISSION:  08/14/2018  Surgery is on August 14, 2018, to be done at [facility] outpatient area on [address]:  The patient is a 52 year old gravida 3, para 86 female presents for management of worsening pelvic relaxation.  She is to undergo a laparoscopic-assisted vaginal hysterectomy with bilateral salpingo-oophorectomy, anterior and  posterior repair along with sacrospinous ligament suspension, midurethral sling and cystoscopy.  In relation to the present admission, the patient is postmenopausal at the present time having no bleeding.  She is having trouble with worsening pelvic  relaxation that has become significantly symptomatic as well as stress urinary incontinence with things such as coughing, sneezing or bearing.  She wishes to proceed with surgical management.  Other alternatives such as pessary or conservative followup  have been discussed.  ALLERGIES:  THE PATIENT IS ALLERGIC TO PENICILLIN.  MEDICATIONS:  None.  PAST MEDICAL HISTORY:  She has had usual childhood diseases without any significant sequelae.  She has had no previous surgeries.  Had 3 spontaneous vaginal deliveries.  SOCIAL HISTORY:  Reveals no tobacco or alcohol use.  FAMILY HISTORY:  Noncontributory.  REVIEW OF SYSTEMS:  Noncontributory.  PHYSICAL EXAMINATION: VITAL SIGNS:  Afebrile, stable vital signs. HEENT:  The patient is normocephalic.  Pupils equal, round, reactive to light and accommodation.  Extraocular movements are intact.  Sclerae and conjunctivae for clear.  Oropharynx clear. NECK:  Without thyromegaly. BREASTS:  Not examined. LUNGS:  Clear. HEART:  Regular rhythm and rate without murmurs or gallops.  No carotid or abdominal bruits. ABDOMEN:  Her abdominal exam is benign.   No masses, organomegaly or tenderness. PELVIC:  Normal external genitalia.  Vaginal mucosa:  She has a moderate uterine descensus with associated cystocele, rectocele.  Uterus normal size, shape and contour.  Adnexa free of mass or tenderness. EXTREMITIES:  Trace edema. NEUROLOGIC:  Grossly normal limits.  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  PLAN:  The patient to undergo a laparoscopic-assisted vaginal hysterectomy with removal of both tubes and ovaries.  We will do an anterior and posterior repair along with sacrospinous ligament suspension.  We will also do a midurethral sling and  cystoscopy.  The nature of the procedure have been discussed.  The risks have been discussed including the risk of hemorrhage that could require transfusion with the risk of AIDS or hepatitis, risk of infection.  Risk of injury to adjacent organs that  could require further exploratory surgery.  Risk of deep venous thrombosis and pulmonary embolus.  With pelvic relaxation surgery, there is a risk of recurrent pelvic relaxation.  With a midurethral sling, there is a risk of over tightening requiring  loosening of the sling.  Risk of mesh or rejection that could require removal of mesh and return of incontinence.  Rare risk of obturator nerve injury leading to buttocks pain and leg weakness.  After discussion of risks and options, she wants to proceed  with the above noted surgery.  AN/NUANCE  D:07/23/2018 T:07/23/2018 JOB:[id number]

## 2018-07-23 NOTE — H&P (Signed)
Patient name Karina Barnett, [place] DICTATION#[id number] CSN# [id number]   Arvella Nigh, MD 07/23/2018 9:04 AM

## 2018-07-28 ENCOUNTER — Other Ambulatory Visit: Payer: Self-pay | Admitting: Obstetrics and Gynecology

## 2018-07-28 DIAGNOSIS — Z803 Family history of malignant neoplasm of breast: Secondary | ICD-10-CM

## 2018-07-28 NOTE — Patient Instructions (Signed)
Karina Barnett  07/28/2018      Your procedure is scheduled on 08-14-18   Report to [facility]  at 5:30 A.M.  Call this number if you have problems the morning of surgery:[phone]  OUR ADDRESS IS [place], WE ARE LOCATED IN THE [facility]ICAL PLA[facility] WITH [facility].   Remember:  Do not eat food or drink liquids after midnight.  Take these medicines the morning of surgery with A SIP OF WATER: Pantoprazole (Protonix)   Do not wear jewelry, make-up or nail polish.  Do not wear lotions, powders, or perfumes, or deoderant.  Do not shave 48 hours prior to surgery.    Do not bring valuables to the hospital.  [facility] is not responsible for any belongings or valuables.  Contacts, dentures or bridgework may not be worn into surgery.  Leave your suitcase in the car.  After surgery it may be brought to your room.  For patients admitted to the hospital, discharge time will be determined by your treatment team.  Patients discharged the day of surgery will not be allowed to drive home.   Special instructions:  Please bring your medication in their original pill bottle.  Please read over the following fact sheets that you were given:       [facility] - Preparing for Surgery Before surgery, you can play an important role.  Because skin is not sterile, your skin needs to be as free of germs as possible.  You can reduce the number of germs on your skin by washing with CHG (chlorahexidine gluconate) soap before surgery.  CHG is an antiseptic cleaner which kills germs and bonds with the skin to continue killing germs even after washing. Please DO NOT use if you have an allergy to CHG or antibacterial soaps.  If your skin becomes reddened/irritated stop using the CHG and inform your nurse when you arrive at Short Stay. Do not shave (including legs and underarms) for at least 48 hours prior to the first CHG shower.  You may shave your face/neck. Please follow  these instructions carefully:  1.  Shower with CHG Soap the night before surgery and the  morning of Surgery.  2.  If you choose to wash your hair, wash your hair first as usual with your  normal  shampoo.  3.  After you shampoo, rinse your hair and body thoroughly to remove the  shampoo.                           4.  Use CHG as you would any other liquid soap.  You can apply chg directly  to the skin and wash                       Gently with a scrungie or clean washcloth.  5.  Apply the CHG Soap to your body ONLY FROM THE NECK DOWN.   Do not use on face/ open                           Wound or open sores. Avoid contact with eyes, ears mouth and genitals (private parts).                       Wash face,  Genitals (private parts) with your normal soap.  6.  Wash thoroughly, paying special attention to the area where your surgery  will be performed.  7.  Thoroughly rinse your body with warm water from the neck down.  8.  DO NOT shower/wash with your normal soap after using and rinsing off  the CHG Soap.                9.  Pat yourself dry with a clean towel.            10.  Wear clean pajamas.            11.  Place clean sheets on your bed the night of your first shower and do not  sleep with pets. Day of Surgery : Do not apply any lotions/deodorants the morning of surgery.  Please wear clean clothes to the hospital/surgery center.  FAILURE TO FOLLOW THESE INSTRUCTIONS MAY RESULT IN THE CANCELLATION OF YOUR SURGERY PATIENT SIGNATURE_________________________________  NURSE SIGNATURE__________________________________  ________________________________________________________________________  WHAT IS A BLOOD TRANSFUSION? Blood Transfusion Information  A transfusion is the replacement of blood or some of its parts. Blood is made up of multiple cells which provide different functions.  Red blood cells carry oxygen and are used for blood loss replacement.  White blood cells fight  against infection.  Platelets control bleeding.  Plasma helps clot blood.  Other blood products are available for specialized needs, such as hemophilia or other clotting disorders. BEFORE THE TRANSFUSION  Who gives blood for transfusions?   Healthy volunteers who are fully evaluated to make sure their blood is safe. This is blood bank blood. Transfusion therapy is the safest it has ever been in the practice of medicine. Before blood is taken from a donor, a complete history is taken to make sure that person has no history of diseases nor engages in risky social behavior (examples are intravenous drug use or sexual activity with multiple partners). The donor's travel history is screened to minimize risk of transmitting infections, such as malaria. The donated blood is tested for signs of infectious diseases, such as HIV and hepatitis. The blood is then tested to be sure it is compatible with you in order to minimize the chance of a transfusion reaction. If you or a relative donates blood, this is often done in anticipation of surgery and is not appropriate for emergency situations. It takes many days to process the donated blood. RISKS AND COMPLICATIONS Although transfusion therapy is very safe and saves many lives, the main dangers of transfusion include:   Getting an infectious disease.  Developing a transfusion reaction. This is an allergic reaction to something in the blood you were given. Every precaution is taken to prevent this. The decision to have a blood transfusion has been considered carefully by your caregiver before blood is given. Blood is not given unless the benefits outweigh the risks. AFTER THE TRANSFUSION  Right after receiving a blood transfusion, you will usually feel much better and more energetic. This is especially true if your red blood cells have gotten low (anemic). The transfusion raises the level of the red blood cells which carry oxygen, and this usually causes an  energy increase.  The nurse administering the transfusion will monitor you carefully for complications. HOME CARE INSTRUCTIONS  No special instructions are needed after a transfusion. You may find your energy is better. Speak with your caregiver about any limitations on activity for underlying diseases you may have. SEEK MEDICAL CARE IF:   Your condition is not improving after your  transfusion.  You develop redness or irritation at the intravenous (IV) site. SEEK IMMEDIATE MEDICAL CARE IF:  Any of the following symptoms occur over the next 12 hours:  Shaking chills.  You have a temperature by mouth above 102 F (38.9 C), not controlled by medicine.  Chest, back, or muscle pain.  People around you feel you are not acting correctly or are confused.  Shortness of breath or difficulty breathing.  Dizziness and fainting.  You get a rash or develop hives.  You have a decrease in urine output.  Your urine turns a dark color or changes to pink, red, or brown. Any of the following symptoms occur over the next 10 days:  You have a temperature by mouth above 102 F (38.9 C), not controlled by medicine.  Shortness of breath.  Weakness after normal activity.  The white part of the eye turns yellow (jaundice).  You have a decrease in the amount of urine or are urinating less often.  Your urine turns a dark color or changes to pink, red, or brown. Document Released: 08/10/2000 Document Revised: 11/05/2011 Document Reviewed: 03/29/2008 [facility] Patient Information 2014 [place], [place].  _______________________________________________________________________

## 2018-08-08 ENCOUNTER — Other Ambulatory Visit: Payer: Self-pay

## 2018-08-08 ENCOUNTER — Encounter (HOSPITAL_COMMUNITY): Payer: Self-pay

## 2018-08-08 ENCOUNTER — Encounter (HOSPITAL_COMMUNITY)
Admission: RE | Admit: 2018-08-08 | Discharge: 2018-08-08 | Disposition: A | Discharge status: Home / Self Care | Payer: BLUE CROSS/BLUE SHIELD | Source: Ambulatory Visit | Attending: Obstetrics and Gynecology | Admitting: Obstetrics and Gynecology

## 2018-08-08 DIAGNOSIS — N393 Stress incontinence (female) (male): Secondary | ICD-10-CM | POA: Diagnosis not present

## 2018-08-08 DIAGNOSIS — Z01812 Encounter for preprocedural laboratory examination: Secondary | ICD-10-CM | POA: Diagnosis not present

## 2018-08-08 DIAGNOSIS — N8189 Other female genital prolapse: Secondary | ICD-10-CM | POA: Diagnosis not present

## 2018-08-08 HISTORY — DX: Family history of other specified conditions: Z84.89

## 2018-08-08 LAB — CBC
HCT: 42.3 % (ref 36.0–46.0)
HEMOGLOBIN: 13.4 g/dL (ref 12.0–15.0)
MCH: 29.2 pg (ref 26.0–34.0)
MCHC: 31.7 g/dL (ref 30.0–36.0)
MCV: 92.2 fL (ref 80.0–100.0)
Platelets: 244 10*3/uL (ref 150–400)
RBC: 4.59 MIL/uL (ref 3.87–5.11)
RDW: 12.7 % (ref 11.5–15.5)
WBC: 5.4 10*3/uL (ref 4.0–10.5)
nRBC: 0 % (ref 0.0–0.2)

## 2018-08-08 LAB — BASIC METABOLIC PANEL
Anion gap: 7 (ref 5–15)
BUN: 20 mg/dL (ref 6–20)
CHLORIDE: 106 mmol/L (ref 98–111)
CO2: 27 mmol/L (ref 22–32)
Calcium: 9.3 mg/dL (ref 8.9–10.3)
Creatinine, Ser: 0.72 mg/dL (ref 0.44–1.00)
GFR calc Af Amer: >60 mL/min (ref >60)
GFR calc non Af Amer: >60 mL/min (ref >60)
Glucose, Bld: 102 mg/dL — ABNORMAL HIGH (ref 70–99)
POTASSIUM: 4.6 mmol/L (ref 3.5–5.1)
SODIUM: 140 mmol/L (ref 135–145)

## 2018-08-08 LAB — ABO/RH: ABO/RH(D): A POS

## 2018-08-12 DIAGNOSIS — D2261 Melanocytic nevi of right upper limb, including shoulder: Secondary | ICD-10-CM | POA: Diagnosis not present

## 2018-08-12 DIAGNOSIS — N8189 Other female genital prolapse: Secondary | ICD-10-CM | POA: Diagnosis not present

## 2018-08-12 DIAGNOSIS — D2262 Melanocytic nevi of left upper limb, including shoulder: Secondary | ICD-10-CM | POA: Diagnosis not present

## 2018-08-12 DIAGNOSIS — C44619 Basal cell carcinoma of skin of left upper limb, including shoulder: Secondary | ICD-10-CM | POA: Diagnosis not present

## 2018-08-12 DIAGNOSIS — L821 Other seborrheic keratosis: Secondary | ICD-10-CM | POA: Diagnosis not present

## 2018-08-12 DIAGNOSIS — N8182 Incompetence or weakening of pubocervical tissue: Secondary | ICD-10-CM | POA: Diagnosis not present

## 2018-08-12 DIAGNOSIS — Z85828 Personal history of other malignant neoplasm of skin: Secondary | ICD-10-CM | POA: Diagnosis not present

## 2018-08-14 ENCOUNTER — Observation Stay (HOSPITAL_BASED_OUTPATIENT_CLINIC_OR_DEPARTMENT_OTHER): Payer: BLUE CROSS/BLUE SHIELD | Admitting: Anesthesiology

## 2018-08-14 ENCOUNTER — Encounter (HOSPITAL_BASED_OUTPATIENT_CLINIC_OR_DEPARTMENT_OTHER): Payer: Self-pay

## 2018-08-14 ENCOUNTER — Encounter (HOSPITAL_COMMUNITY)
Admission: RE | Disposition: A | Discharge status: Home / Self Care | Payer: Self-pay | Source: Home / Self Care | Attending: Obstetrics and Gynecology

## 2018-08-14 ENCOUNTER — Observation Stay (HOSPITAL_BASED_OUTPATIENT_CLINIC_OR_DEPARTMENT_OTHER)
Admission: RE | Admit: 2018-08-14 | Discharge: 2018-08-15 | Disposition: A | Discharge status: Home / Self Care | Payer: BLUE CROSS/BLUE SHIELD | Attending: Obstetrics and Gynecology | Admitting: Obstetrics and Gynecology

## 2018-08-14 DIAGNOSIS — N888 Other specified noninflammatory disorders of cervix uteri: Secondary | ICD-10-CM | POA: Diagnosis not present

## 2018-08-14 DIAGNOSIS — N329 Bladder disorder, unspecified: Secondary | ICD-10-CM | POA: Diagnosis not present

## 2018-08-14 DIAGNOSIS — N8301 Follicular cyst of right ovary: Secondary | ICD-10-CM | POA: Insufficient documentation

## 2018-08-14 DIAGNOSIS — R9341 Abnormal radiologic findings on diagnostic imaging of renal pelvis, ureter, or bladder: Secondary | ICD-10-CM | POA: Diagnosis not present

## 2018-08-14 DIAGNOSIS — Z880 Allergy status to penicillin: Secondary | ICD-10-CM | POA: Diagnosis not present

## 2018-08-14 DIAGNOSIS — Z79899 Other long term (current) drug therapy: Secondary | ICD-10-CM | POA: Diagnosis not present

## 2018-08-14 DIAGNOSIS — F419 Anxiety disorder, unspecified: Secondary | ICD-10-CM | POA: Diagnosis not present

## 2018-08-14 DIAGNOSIS — N393 Stress incontinence (female) (male): Secondary | ICD-10-CM | POA: Diagnosis not present

## 2018-08-14 DIAGNOSIS — N8189 Other female genital prolapse: Secondary | ICD-10-CM | POA: Diagnosis present

## 2018-08-14 DIAGNOSIS — N800 Endometriosis of uterus: Secondary | ICD-10-CM | POA: Diagnosis not present

## 2018-08-14 DIAGNOSIS — Z90710 Acquired absence of both cervix and uterus: Secondary | ICD-10-CM | POA: Diagnosis present

## 2018-08-14 DIAGNOSIS — N3289 Other specified disorders of bladder: Secondary | ICD-10-CM | POA: Diagnosis not present

## 2018-08-14 DIAGNOSIS — N838 Other noninflammatory disorders of ovary, fallopian tube and broad ligament: Secondary | ICD-10-CM | POA: Diagnosis not present

## 2018-08-14 DIAGNOSIS — D649 Anemia, unspecified: Secondary | ICD-10-CM | POA: Diagnosis not present

## 2018-08-14 HISTORY — PX: CYSTOSCOPY: SHX5120

## 2018-08-14 HISTORY — PX: ANTERIOR AND POSTERIOR REPAIR WITH SACROSPINOUS FIXATION: SHX6536

## 2018-08-14 HISTORY — PX: LAPAROSCOPIC VAGINAL HYSTERECTOMY WITH SALPINGO OOPHORECTOMY: SHX6681

## 2018-08-14 HISTORY — PX: BLADDER SUSPENSION: SHX72

## 2018-08-14 HISTORY — DX: Acquired absence of both cervix and uterus: Z90.710

## 2018-08-14 LAB — TYPE AND SCREEN
ABO/RH(D): A POS
ANTIBODY SCREEN: NEGATIVE

## 2018-08-14 SURGERY — HYSTERECTOMY, VAGINAL, LAPAROSCOPY-ASSISTED, WITH SALPINGO-OOPHORECTOMY
Anesthesia: General

## 2018-08-14 MED ORDER — ONDANSETRON HCL 4 MG/2ML IJ SOLN
4.0000 mg | Freq: Four times a day (QID) | INTRAMUSCULAR | Status: DC | PRN
  Filled 2018-08-14: qty 2

## 2018-08-14 MED ORDER — DEXAMETHASONE SODIUM PHOSPHATE 10 MG/ML IJ SOLN
INTRAMUSCULAR | Status: DC | PRN
  Administered 2018-08-14: 10 mg via INTRAVENOUS

## 2018-08-14 MED ORDER — SUGAMMADEX SODIUM 200 MG/2ML IV SOLN
INTRAVENOUS | Status: AC
  Filled 2018-08-14: qty 2

## 2018-08-14 MED ORDER — HYDROMORPHONE 1 MG/ML IV SOLN
INTRAVENOUS | Status: DC
  Filled 2018-08-14 (×2): qty 25

## 2018-08-14 MED ORDER — PHENYLEPHRINE HCL 10 MG/ML IJ SOLN
INTRAMUSCULAR | Status: AC
  Filled 2018-08-14: qty 1

## 2018-08-14 MED ORDER — FENTANYL CITRATE (PF) 250 MCG/5ML IJ SOLN
INTRAMUSCULAR | Status: AC
  Filled 2018-08-14: qty 5

## 2018-08-14 MED ORDER — FENTANYL CITRATE (PF) 250 MCG/5ML IJ SOLN
INTRAMUSCULAR | Status: DC | PRN
  Administered 2018-08-14 (×2): 100 ug via INTRAVENOUS
  Administered 2018-08-14 (×3): 50 ug via INTRAVENOUS
  Administered 2018-08-14: 100 ug via INTRAVENOUS
  Administered 2018-08-14: 50 ug via INTRAVENOUS

## 2018-08-14 MED ORDER — KETOROLAC TROMETHAMINE 30 MG/ML IJ SOLN
INTRAMUSCULAR | Status: DC | PRN
  Administered 2018-08-14: 30 mg via INTRAVENOUS

## 2018-08-14 MED ORDER — HYDROMORPHONE 1 MG/ML IV SOLN
INTRAVENOUS | Status: DC
  Administered 2018-08-14: 30 mg via INTRAVENOUS
  Administered 2018-08-14: 0.4 mg via INTRAVENOUS
  Administered 2018-08-14: 1.2 mg via INTRAVENOUS
  Administered 2018-08-15: 0.8 mg via INTRAVENOUS
  Administered 2018-08-15: 0.4 mg via INTRAVENOUS
  Filled 2018-08-14 (×6): qty 30

## 2018-08-14 MED ORDER — BUPIVACAINE HCL (PF) 0.25 % IJ SOLN
INTRAMUSCULAR | Status: DC | PRN
  Administered 2018-08-14: 6 mL

## 2018-08-14 MED ORDER — ONDANSETRON HCL 4 MG/2ML IJ SOLN
4.0000 mg | Freq: Once | INTRAMUSCULAR | Status: DC | PRN
  Filled 2018-08-14: qty 2

## 2018-08-14 MED ORDER — ROCURONIUM BROMIDE 10 MG/ML (PF) SYRINGE
PREFILLED_SYRINGE | INTRAVENOUS | Status: DC | PRN
  Administered 2018-08-14 (×2): 10 mg via INTRAVENOUS
  Administered 2018-08-14: 5 mg via INTRAVENOUS
  Administered 2018-08-14: 50 mg via INTRAVENOUS

## 2018-08-14 MED ORDER — SODIUM CHLORIDE 0.9 % IR SOLN
Status: DC | PRN
  Administered 2018-08-14: 1000 mL via INTRAVESICAL
  Administered 2018-08-14: 3000 mL

## 2018-08-14 MED ORDER — LACTATED RINGERS IV SOLN
INTRAVENOUS | Status: DC
  Administered 2018-08-14: 06:00:00 via INTRAVENOUS
  Administered 2018-08-14: 1000 mL via INTRAVENOUS
  Administered 2018-08-14: 09:00:00 via INTRAVENOUS
  Filled 2018-08-14: qty 1000

## 2018-08-14 MED ORDER — PROPOFOL 10 MG/ML IV BOLUS
INTRAVENOUS | Status: DC | PRN
  Administered 2018-08-14: 180 mg via INTRAVENOUS

## 2018-08-14 MED ORDER — ONDANSETRON HCL 4 MG/2ML IJ SOLN
INTRAMUSCULAR | Status: DC | PRN
  Administered 2018-08-14 (×2): 4 mg via INTRAVENOUS

## 2018-08-14 MED ORDER — ROCURONIUM BROMIDE 10 MG/ML (PF) SYRINGE
PREFILLED_SYRINGE | INTRAVENOUS | Status: AC
  Filled 2018-08-14: qty 10

## 2018-08-14 MED ORDER — OXYCODONE HCL 5 MG/5ML PO SOLN
5.0000 mg | Freq: Once | ORAL | Status: DC | PRN
  Filled 2018-08-14: qty 5

## 2018-08-14 MED ORDER — EPHEDRINE 5 MG/ML INJ
INTRAVENOUS | Status: AC
  Filled 2018-08-14: qty 20

## 2018-08-14 MED ORDER — GENTAMICIN SULFATE 40 MG/ML IJ SOLN
INTRAVENOUS | Status: AC
  Administered 2018-08-14: 420 mg via INTRAVENOUS
  Filled 2018-08-14: qty 10.5

## 2018-08-14 MED ORDER — PHENYLEPHRINE 40 MCG/ML (10ML) SYRINGE FOR IV PUSH (FOR BLOOD PRESSURE SUPPORT)
PREFILLED_SYRINGE | INTRAVENOUS | Status: AC
  Filled 2018-08-14: qty 10

## 2018-08-14 MED ORDER — OXYCODONE HCL 5 MG PO TABS
5.0000 mg | ORAL_TABLET | Freq: Once | ORAL | Status: DC | PRN
  Filled 2018-08-14: qty 1

## 2018-08-14 MED ORDER — MIDAZOLAM HCL 2 MG/2ML IJ SOLN
INTRAMUSCULAR | Status: AC
  Filled 2018-08-14: qty 2

## 2018-08-14 MED ORDER — FENTANYL CITRATE (PF) 100 MCG/2ML IJ SOLN
25.0000 ug | INTRAMUSCULAR | Status: DC | PRN
  Administered 2018-08-14 (×4): 25 ug via INTRAVENOUS
  Filled 2018-08-14: qty 1

## 2018-08-14 MED ORDER — DIPHENHYDRAMINE HCL 12.5 MG/5ML PO ELIX
12.5000 mg | ORAL_SOLUTION | Freq: Four times a day (QID) | ORAL | Status: DC | PRN
  Filled 2018-08-14: qty 5

## 2018-08-14 MED ORDER — FENTANYL CITRATE (PF) 100 MCG/2ML IJ SOLN
INTRAMUSCULAR | Status: AC
  Filled 2018-08-14: qty 2

## 2018-08-14 MED ORDER — LIDOCAINE 2% (20 MG/ML) 5 ML SYRINGE
INTRAMUSCULAR | Status: AC
  Filled 2018-08-14: qty 15

## 2018-08-14 MED ORDER — MENTHOL 3 MG MT LOZG
1.0000 | LOZENGE | OROMUCOSAL | Status: DC | PRN
  Filled 2018-08-14: qty 9

## 2018-08-14 MED ORDER — SODIUM CHLORIDE 0.9% FLUSH
9.0000 mL | INTRAVENOUS | Status: DC | PRN
  Filled 2018-08-14: qty 10

## 2018-08-14 MED ORDER — SUGAMMADEX SODIUM 200 MG/2ML IV SOLN
INTRAVENOUS | Status: DC | PRN
  Administered 2018-08-14: 200 mg via INTRAVENOUS

## 2018-08-14 MED ORDER — ONDANSETRON HCL 4 MG PO TABS
4.0000 mg | ORAL_TABLET | Freq: Four times a day (QID) | ORAL | Status: DC | PRN
  Filled 2018-08-14: qty 1

## 2018-08-14 MED ORDER — NALOXONE HCL 0.4 MG/ML IJ SOLN
0.4000 mg | INTRAMUSCULAR | Status: DC | PRN
  Filled 2018-08-14: qty 1

## 2018-08-14 MED ORDER — DEXAMETHASONE SODIUM PHOSPHATE 10 MG/ML IJ SOLN
INTRAMUSCULAR | Status: AC
  Filled 2018-08-14: qty 2

## 2018-08-14 MED ORDER — LACTATED RINGERS IV SOLN
INTRAVENOUS | Status: DC
  Administered 2018-08-14: 20:00:00 via INTRAVENOUS
  Filled 2018-08-14: qty 1000

## 2018-08-14 MED ORDER — EPHEDRINE SULFATE-NACL 50-0.9 MG/10ML-% IV SOSY
PREFILLED_SYRINGE | INTRAVENOUS | Status: DC | PRN
  Administered 2018-08-14 (×2): 5 mg via INTRAVENOUS

## 2018-08-14 MED ORDER — LIDOCAINE-EPINEPHRINE (PF) 1 %-1:200000 IJ SOLN
INTRAMUSCULAR | Status: DC | PRN
  Administered 2018-08-14: 8 mL
  Administered 2018-08-14: 10 mL

## 2018-08-14 MED ORDER — SUCCINYLCHOLINE CHLORIDE 200 MG/10ML IV SOSY
PREFILLED_SYRINGE | INTRAVENOUS | Status: AC
  Filled 2018-08-14: qty 20

## 2018-08-14 MED ORDER — PROPOFOL 10 MG/ML IV BOLUS
INTRAVENOUS | Status: AC
  Filled 2018-08-14: qty 20

## 2018-08-14 MED ORDER — ONDANSETRON HCL 4 MG/2ML IJ SOLN
INTRAMUSCULAR | Status: AC
  Filled 2018-08-14: qty 4

## 2018-08-14 MED ORDER — LIDOCAINE 2% (20 MG/ML) 5 ML SYRINGE
INTRAMUSCULAR | Status: DC | PRN
  Administered 2018-08-14: 40 mg via INTRAVENOUS
  Administered 2018-08-14: 60 mg via INTRAVENOUS

## 2018-08-14 MED ORDER — DOCUSATE SODIUM 100 MG PO CAPS
100.0000 mg | ORAL_CAPSULE | Freq: Two times a day (BID) | ORAL | Status: DC
  Administered 2018-08-14: 100 mg via ORAL
  Filled 2018-08-14: qty 1

## 2018-08-14 MED ORDER — DIPHENHYDRAMINE HCL 50 MG/ML IJ SOLN
12.5000 mg | Freq: Four times a day (QID) | INTRAMUSCULAR | Status: DC | PRN
  Filled 2018-08-14: qty 0.25

## 2018-08-14 MED ORDER — MIDAZOLAM HCL 2 MG/2ML IJ SOLN
INTRAMUSCULAR | Status: DC | PRN
  Administered 2018-08-14: 2 mg via INTRAVENOUS

## 2018-08-14 SURGICAL SUPPLY — 87 items
BAG RETRIEVAL 10 (BASKET)
BAG URINE DRAINAGE (UROLOGICAL SUPPLIES) ×3 IMPLANT
BLADE CLIPPER SURG (BLADE) IMPLANT
BLADE SURG 15 STRL LF DISP TIS (BLADE) ×2 IMPLANT
BLADE SURG 15 STRL SS (BLADE) ×1
BNDG GAUZE ELAST 4 BULKY (GAUZE/BANDAGES/DRESSINGS) ×1 IMPLANT
CANISTER SUCT 3000ML PPV (MISCELLANEOUS) ×6 IMPLANT
CATH ROBINSON RED A/P 16FR (CATHETERS) ×4 IMPLANT
COVER BACK TABLE 60X90IN (DRAPES) ×2 IMPLANT
COVER MAYO STAND STRL (DRAPES) ×7 IMPLANT
COVER SURGICAL LIGHT HANDLE (MISCELLANEOUS) IMPLANT
COVER WAND RF STERILE (DRAPES) ×3 IMPLANT
DECANTER SPIKE VIAL GLASS SM (MISCELLANEOUS) IMPLANT
DERMABOND ADVANCED (GAUZE/BANDAGES/DRESSINGS) ×1
DERMABOND ADVANCED .7 DNX12 (GAUZE/BANDAGES/DRESSINGS) ×2 IMPLANT
DEVICE CAPIO SLIM SINGLE (INSTRUMENTS) ×1 IMPLANT
DRSG COVADERM PLUS 2X2 (GAUZE/BANDAGES/DRESSINGS) IMPLANT
DRSG OPSITE POSTOP 3X4 (GAUZE/BANDAGES/DRESSINGS) ×3 IMPLANT
DURAPREP 26ML APPLICATOR (WOUND CARE) ×3 IMPLANT
ELECT REM PT RETURN 9FT ADLT (ELECTROSURGICAL) ×3
ELECTRODE REM PT RTRN 9FT ADLT (ELECTROSURGICAL) ×2 IMPLANT
GAUZE 4X4 16PLY RFD (DISPOSABLE) ×4 IMPLANT
GAUZE SPONGE 4X4 16PLY XRAY LF (GAUZE/BANDAGES/DRESSINGS) ×2 IMPLANT
GLOVE BIO SURGEON STRL SZ7 (GLOVE) ×12 IMPLANT
GLOVE ECLIPSE 6.5 STRL STRAW (GLOVE) ×3 IMPLANT
GOWN STRL REUS W/ TWL XL LVL3 (GOWN DISPOSABLE) ×2 IMPLANT
GOWN STRL REUS W/TWL XL LVL3 (GOWN DISPOSABLE) ×4 IMPLANT
HOLDER FOLEY CATH W/STRAP (MISCELLANEOUS) ×3 IMPLANT
KIT TURNOVER CYSTO (KITS) ×3 IMPLANT
NDL MAYO 6 CRC TAPER PT (NEEDLE) ×2 IMPLANT
NEEDLE HYPO 22GX1.5 SAFETY (NEEDLE) ×3 IMPLANT
NEEDLE INSUFFLATION 120MM (ENDOMECHANICALS) ×1 IMPLANT
NEEDLE MAYO 6 CRC TAPER PT (NEEDLE) ×3 IMPLANT
NS IRRIG 1000ML POUR BTL (IV SOLUTION) ×3 IMPLANT
NS IRRIG 500ML POUR BTL (IV SOLUTION) ×3 IMPLANT
PACK LAVH (CUSTOM PROCEDURE TRAY) ×3 IMPLANT
PACK ROBOTIC GOWN (GOWN DISPOSABLE) ×1 IMPLANT
PACK TRENDGUARD 450 HYBRID PRO (MISCELLANEOUS) IMPLANT
PACK VAGINAL WOMENS (CUSTOM PROCEDURE TRAY) ×3 IMPLANT
PACKING VAGINAL (PACKING) ×1 IMPLANT
PAD OB MATERNITY 4.3X12.25 (PERSONAL CARE ITEMS) ×3 IMPLANT
PAD PREP 24X48 CUFFED NSTRL (MISCELLANEOUS) ×3 IMPLANT
PLUG CATH AND CAP STER (CATHETERS) ×3 IMPLANT
SCISSORS LAP 5X45 EPIX DISP (ENDOMECHANICALS) IMPLANT
SEALER TISSUE G2 CVD JAW 45CM (ENDOMECHANICALS) ×3 IMPLANT
SET IRRIG TUBING LAPAROSCOPIC (IRRIGATION / IRRIGATOR) ×3 IMPLANT
SET IRRIG Y TYPE TUR BLADDER L (SET/KITS/TRAYS/PACK) ×3 IMPLANT
SLING HALO OBTRYX (Sling) ×1 IMPLANT
SLING HALO OBTRYX NDL (Sling) IMPLANT
SOLUTION ELECTROLUBE (MISCELLANEOUS) IMPLANT
STRIP CLOSURE SKIN 1/4X4 (GAUZE/BANDAGES/DRESSINGS) IMPLANT
SUT CAPIO POLYGLYCOLIC (SUTURE) ×1 IMPLANT
SUT CHROMIC 2 0 SH (SUTURE) IMPLANT
SUT CHROMIC 3 0 SH 27 (SUTURE) ×3 IMPLANT
SUT GUT CHROMIC 3 0 (SUTURE) IMPLANT
SUT MON AB 3-0 SH 27 (SUTURE)
SUT MON AB 3-0 SH27 (SUTURE) IMPLANT
SUT VIC AB 0 CT1 18XCR BRD8 (SUTURE) ×4 IMPLANT
SUT VIC AB 0 CT1 36 (SUTURE) ×6 IMPLANT
SUT VIC AB 0 CT1 8-18 (SUTURE) ×5
SUT VIC AB 2-0 CT1 (SUTURE) ×3 IMPLANT
SUT VIC AB 2-0 CT1 27 (SUTURE)
SUT VIC AB 2-0 CT1 TAPERPNT 27 (SUTURE) IMPLANT
SUT VIC AB 2-0 CT2 27 (SUTURE) IMPLANT
SUT VIC AB 2-0 SH 27 (SUTURE)
SUT VIC AB 2-0 SH 27XBRD (SUTURE) IMPLANT
SUT VIC AB 2-0 UR6 27 (SUTURE) ×3 IMPLANT
SUT VIC AB 3-0 SH 27 (SUTURE)
SUT VIC AB 3-0 SH 27X BRD (SUTURE) IMPLANT
SUT VICRYL 0 UR6 27IN ABS (SUTURE) IMPLANT
SUT VICRYL 1 TIES 12X18 (SUTURE) ×3 IMPLANT
SUT VICRYL 4-0 PS2 18IN ABS (SUTURE) ×3 IMPLANT
SYR BULB IRRIGATION 50ML (SYRINGE) IMPLANT
SYS BAG RETRIEVAL 10MM (BASKET)
SYSTEM BAG RETRIEVAL 10MM (BASKET) IMPLANT
TOWEL OR 17X24 6PK STRL BLUE (TOWEL DISPOSABLE) ×6 IMPLANT
TRAY FOLEY W/BAG SLVR 14FR (SET/KITS/TRAYS/PACK) ×3 IMPLANT
TRENDGUARD 450 HYBRID PRO PACK (MISCELLANEOUS) ×3
TROCAR BALLN 12MMX100 BLUNT (TROCAR) IMPLANT
TROCAR BLADELESS OPT 12M 100M (ENDOMECHANICALS) IMPLANT
TROCAR OPTI TIP 5M 100M (ENDOMECHANICALS) ×3 IMPLANT
TROCAR XCEL DIL TIP R 11M (ENDOMECHANICALS) ×1 IMPLANT
TUBE CONNECTING 12X1/4 (SUCTIONS) IMPLANT
TUBING INSUF HEATED (TUBING) ×3 IMPLANT
WARMER LAPAROSCOPE (MISCELLANEOUS) ×3 IMPLANT
WATER STERILE IRR 3000ML UROMA (IV SOLUTION) ×3 IMPLANT
WATER STERILE IRR 500ML POUR (IV SOLUTION) ×3 IMPLANT

## 2018-08-14 NOTE — H&P (Signed)
  History and physical exam unchanged 

## 2018-08-14 NOTE — Progress Notes (Signed)
Patient ID: Karina Barnett, female   DOB: August 17, 1966, 52 y.o.   MRN: [id number] AF VSS INC CLEAR NO VAGINAL BLEEDING GOOD UO

## 2018-08-14 NOTE — Transfer of Care (Signed)
Immediate Anesthesia Transfer of Care Note  Patient: Karina Barnett  Procedure(s) Performed: LAPAROSCOPIC ASSISTED VAGINAL HYSTERECTOMY WITH SALPINGO OOPHORECTOMY (Bilateral ) ANTERIOR AND POSTERIOR REPAIR WITH SACROSPINOUS FIXATION (N/A ) transobturator sling (N/A ) CYSTOSCOPY (N/A )  Patient Location: PACU  Anesthesia Type:General  Level of Consciousness: awake and alert   Airway & Oxygen Therapy: Patient Spontanous Breathing and Patient connected to face mask oxygen  Post-op Assessment: Report given to RN and Post -op Vital signs reviewed and stable  Post vital signs: Reviewed and stable  Last Vitals:  Vitals Value Taken Time  BP 126/68 08/14/2018 10:25 AM  Temp 37.1 C 08/14/2018 10:25 AM  Pulse 72 08/14/2018 10:29 AM  Resp 16 08/14/2018 10:29 AM  SpO2 100 % 08/14/2018 10:29 AM  Vitals shown include unvalidated device data.  Last Pain:  Vitals:   08/14/18 0558  TempSrc:   PainSc: 0-No pain      Patients Stated Pain Goal: 6 (58/30/94 0768)  Complications: No apparent anesthesia complications

## 2018-08-14 NOTE — Anesthesia Procedure Notes (Signed)
Date/Time: 08/14/2018 10:18 AM Performed by: Cynda Familia, CRNA Oxygen Delivery Method: Simple face mask Placement Confirmation: positive ETCO2 and breath sounds checked- equal and bilateral Dental Injury: Teeth and Oropharynx as per pre-operative assessment

## 2018-08-14 NOTE — Op Note (Signed)
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  OPERATIVE PROCEDURE:  Laparoscopic-assisted vaginal hysterectomy with bilateral salpingo-oophorectomy.  Anterior and posterior colporrhaphy.  Sacrospinous ligament suspension.  Midurethral sling using a transobturator approach.  Cystoscopy.  SURGEON:  Darlyn Chamber, MD  ASSISTANT:  Gertie Fey  ANESTHESIA:  General.  ESTIMATED BLOOD LOSS:  300 mL.  PACKS:  Include a vaginal pack.  DRAINS:  Included urethral Foley.  INTRAOPERATIVE BLOOD PLACED:  None.  COMPLICATIONS:  None.  INDICATIONS:  As dictated in History and Physical.  DESCRIPTION OF PROCEDURE:  The patient was taken to the OR and placed in supine position.  After satisfactory level of general anesthesia was obtained, she was placed in the dorsal lithotomy position using Allen stirrups.  The abdomen was prepped with  DuraPrep.  The perineum and vagina were prepped out with Betadine.  The speculum was placed in the vaginal vault.  The cervix was grasped with a single-tooth tenaculum.  A Hulka tenaculum was put in place and secured.  The bladder was in and out  catheterization.  The patient was then draped in a sterile field.  A subumbilical incision made with a knife.  The Veress needle was introduced in the abdominal cavity.  Abdomen inflated with approximately 3 L of carbon dioxide.  Operative laparoscope  was then reduced.  Visualization with no evidence of injury to adjacent organs.  A 5 mm trocar was put in place in suprapubic area under direct visualization.  The appendix was normal.  Upper abdomen including liver and tip of the  gallbladder were clear.   Uterus, tubes, and ovaries were unremarkable.  We first went to the right side.  We could easily see the ureter on the right side.  The right tube and ovary were elevated.  The ovarian vasculature was cauterized and incised using the EnSeal.   Mesenteric attachments of the ovary and tube were cauterized and incised up to the round ligament.  Then, the right round ligament was cauterized and incised.  We then went to the left side.  Left tube and ovary were elevated.  The ureter was easily  identified along the pelvic sidewall.  The left ovarian vessels were cauterized and incised.  The mesenteric attachments of the tube and ovary were cauterized and incised up to the left round ligament, and left round ligament was cauterized and incised.   We had good hemostasis.  At this point in time, the abdomen was deflated of carbon dioxide.  Laparoscope was then removed.  We went vaginally.  A weighted speculum was placed in the vaginal vault.  The cervix was grasped with a Ardis Hughs tenaculum.   Cul-de-sac was entered sharply.  Both uterosacral ligaments were clamped, cut and suture ligated with 0 Vicryl.  The reflection of the vaginal mucosa anteriorly was incised, and bladder was dissected superiorly.  Paracervical tissue was clamped, cut and  suture ligated with 0 Vicryl.  Vesicouterine space was identified and entered sharply.  Retractors were put in place to retract the bladder superiorly.  Using the clamp, cut, and tie technique with suture ligature of 0 Vicryl, the parametrium was  serially separated from the sides of the uterus.  The uterus was then flipped.  The remaining pedicles were clamped  and cut.  The uterus, tubes and ovaries were passed off the operative field.  Held pedicles were secured with free ties of 0 Vicryl.   Areas of bleeding were all controlled with figure-of-eight and 0 Vicryl.  Posterior cuff was then run with a running locking suture of 0 Vicryl.  We then  proceeded with the cystocele repair.  The vaginal mucosa was instilled with Xylocaine with epinephrine.  Using a knife, a midline incision was made.  Using sharp and blunt dissection, we released the vaginal mucosa from the underlying  pubocervical fascia.  Then, using interrupted suture of 2-0 Vicryl, the pubocervical fascia was brought together in the midline, thereby reducing the cystocele.  Vaginal edges were then trimmed.  They were reapproximated with interrupted sutures of 0  Vicryl.  A uterosacral plication stitch of 0 Vicryl was then put in place and secured.  The remaining vaginal cuff was closed with figure-of-eight of 0 Vicryl.  We then went to the posterior repair.  The incision was made over the perineal body and dissected superiorly.  The skin was then excised.  The vaginal mucosa was infiltrated with 1% Xylocaine with epinephrine.  Using sharp dissection, we undermined the  vaginal mucosa and released it from its underlying tissue.  We then incised it in the midline.  Using blunt and sharp dissection, we dissected the tissue off the overlying vaginal mucosa on each side.  At this point in time, we had good release of the  colon and rectum from the vaginal mucosa.  We could easily go out on the right side and identify the sacrospinous ligament.  At this point in time, using the Capio, a suture of 0 Vicryl was put through the sacrospinous ligament.  It was then secured to  the top of the vagina with a figure-of-eight.  Perirectal fascia was brought together in midline using interrupted sutures of 0 Vicryl.  We began closing the vaginal mucosa with interrupted sutures of 0 Vicryl.  We then secured the sacrospinous ligament.   We had good elevation of the vaginal cuff.  The remaining vaginal mucosa was closed with interrupted figure-of-eights of 0 Vicryl.  The perineal body was rebuilt with a running suture of 2-0 Vicryl, and skin was closed with running subcuticular of 2-0  Vicryl.  We had good  hemostasis and good reduction of the rectocele and cystocele and good support of the vaginal cuff.  Next, a 7 urethral incision was made.  Two areas on the groin were identified at the level of the clitoris below the adductus longus muscle and just lateral to the inferior pubic ramus.  These areas were identified and marked, infiltrated with 1%  Xylocaine.  A punch incision was made with a knife.  Next, we dissected the suburethral incision out laterally on each side using blunt and sharp dissection.  Next, both needles for the transobturator sling was put through the skin around the inferior  pubic ramus and out through the suburethral incision on both sides.  At this point, cystoscopy was performed.  There is no evidence of injury to the bladder or urethra.  Both ureters were identified and noted to be spilling jets of clear urine.  She did  have a papular outgrowth in the bladder.  We took a photograph of it.  Dr. ____ did visualize it, and we will follow that up.  The cystoscope was then removed.  The mesh was hooked to both needles and brought out through the skin.  The  plastic sheaths  were removed.  We adjusted the mesh underneath the urethra until it laid flat, but you could easily put a Kelly and rotate at 90 degrees.  The ends of the mesh were trimmed.  The groin incisions were glued.  Suburethral incision was closed with a running  suture of 2-0 Vicryl.  We had good hemostasis.  Foley was placed to straight drain.  Clear urine was noted.  The laparoscope was reinserted.  Visualization revealed minimal oozing from the vaginal cuff but was controlled with bipolar.  We did some cauterizing on the area of the ovarian vasculature, but we had good hemostasis.  No evidence of injury to adjacent  organs.  We thoroughly irrigated.  We removed the irrigation.  It had good hemostasis.  We deflated the abdomen and reinflated.  No bleeding was encountered.  The abdomen was deflated of carbon dioxide.  All  trocars were removed.  Subumbilical incision  was closed with interrupted subcuticulars of 4-0 Vicryl.  Suprapubic incisions were closed with Dermabond.  A vaginal pack was put in place.  Rectal exam was unremarkable.  The patient was taken out of dorsal lithotomy position, was alert and extubated and transferred to recovery room in good condition.  Sponge, instrument and needle count was correct by circulating nurse x2.  LN/NUANCE  D:08/14/2018 T:08/14/2018 JOB:[id number]

## 2018-08-14 NOTE — Anesthesia Preprocedure Evaluation (Addendum)
Anesthesia Evaluation  Patient identified by MRN, date of birth, ID band Patient awake    Reviewed: Allergy & Precautions, NPO status , Patient's Chart, lab work & pertinent test results  Airway Mallampati: II  TM Distance: >3 FB Neck ROM: Full    Dental  (+) Teeth Intact, Dental Advisory Given   Pulmonary    breath sounds clear to auscultation       Cardiovascular  Rhythm:Regular Rate:Normal     Neuro/Psych    GI/Hepatic   Endo/Other    Renal/GU      Musculoskeletal   Abdominal   Peds  Hematology   Anesthesia Other Findings   Reproductive/Obstetrics                             Anesthesia Physical Anesthesia Plan  ASA: I  Anesthesia Plan: General   Post-op Pain Management:    Induction: Intravenous  PONV Risk Score and Plan: Ondansetron and Dexamethasone  Airway Management Planned: Oral ETT  Additional Equipment:   Intra-op Plan:   Post-operative Plan: Extubation in OR  Informed Consent: I have reviewed the patients History and Physical, chart, labs and discussed the procedure including the risks, benefits and alternatives for the proposed anesthesia with the patient or authorized representative who has indicated his/her understanding and acceptance.     Dental advisory given  Plan Discussed with: CRNA and Anesthesiologist  Anesthesia Plan Comments:         Anesthesia Quick Evaluation  

## 2018-08-14 NOTE — Anesthesia Procedure Notes (Signed)
Procedure Name: Intubation Date/Time: 08/14/2018 7:28 AM Performed by: Cynda Familia, CRNA Pre-anesthesia Checklist: Patient identified, Emergency Drugs available, Suction available and Patient being monitored Patient Re-evaluated:Patient Re-evaluated prior to induction Oxygen Delivery Method: Circle System Utilized Preoxygenation: Pre-oxygenation with 100% oxygen Induction Type: IV induction and Cricoid Pressure applied Ventilation: Mask ventilation without difficulty Laryngoscope Size: Miller and 2 Grade View: Grade I Tube type: Oral Tube size: 7.0 mm Number of attempts: 1 Airway Equipment and Method: Stylet Placement Confirmation: ETT inserted through vocal cords under direct vision,  positive ETCO2 and breath sounds checked- equal and bilateral Secured at: 21 cm Tube secured with: Tape Dental Injury: Teeth and Oropharynx as per pre-operative assessment  Comments: Smooth IV induction  By Linna Caprice-- intubation AM CRNA atraumatic- teeth and mouth as preop bilat BS Joslin

## 2018-08-14 NOTE — Brief Op Note (Signed)
08/14/2018  10:25 AM  PATIENT:  Karina Barnett  52 y.o. female  PRE-OPERATIVE DIAGNOSIS:  pelvic relaxation  POST-OPERATIVE DIAGNOSIS:  pelvic relaxation  PROCEDURE:  Procedure(s) with comments: LAPAROSCOPIC ASSISTED VAGINAL HYSTERECTOMY WITH SALPINGO OOPHORECTOMY (Bilateral) - need bed ANTERIOR AND POSTERIOR REPAIR WITH SACROSPINOUS FIXATION (N/A) transobturator sling (N/A) CYSTOSCOPY (N/A)  SURGEON:  Surgeon(s) and Role:    * Arvella Nigh, MD - Primary    * Everlene Farrier, MD - Assisting  PHYSICIAN ASSISTANT:   ASSISTANTS: tomblin    ANESTHESIA:   local and general  EBL:  200 mL   BLOOD ADMINISTERED:none  DRAINS: Urinary Catheter (Foley)   LOCAL MEDICATIONS USED:  XYLOCAINE   SPECIMEN:  Source of Specimen:  uterus tubes and ovaties  DISPOSITION OF SPECIMEN:  PATHOLOGY  COUNTS:  YES  TOURNIQUET:  * No tourniquets in log *  DICTATION: .Other Dictation: Dictation Number [id number]  PLAN OF CARE: Admit for overnight observation  PATIENT DISPOSITION:  PACU - hemodynamically stable.   Delay start of Pharmacological VTE agent (>24hrs) due to surgical blood loss or risk of bleeding: no

## 2018-08-14 NOTE — Progress Notes (Signed)
Patient care in PACU, [phone], was by Sharmaine Base, RN even though it was documented under Nicola Police, RN

## 2018-08-15 DIAGNOSIS — N8301 Follicular cyst of right ovary: Secondary | ICD-10-CM | POA: Diagnosis not present

## 2018-08-15 DIAGNOSIS — N800 Endometriosis of uterus: Secondary | ICD-10-CM | POA: Diagnosis not present

## 2018-08-15 DIAGNOSIS — Z79899 Other long term (current) drug therapy: Secondary | ICD-10-CM | POA: Diagnosis not present

## 2018-08-15 DIAGNOSIS — N888 Other specified noninflammatory disorders of cervix uteri: Secondary | ICD-10-CM | POA: Diagnosis not present

## 2018-08-15 DIAGNOSIS — Z880 Allergy status to penicillin: Secondary | ICD-10-CM | POA: Diagnosis not present

## 2018-08-15 DIAGNOSIS — N838 Other noninflammatory disorders of ovary, fallopian tube and broad ligament: Secondary | ICD-10-CM | POA: Diagnosis not present

## 2018-08-15 DIAGNOSIS — N393 Stress incontinence (female) (male): Secondary | ICD-10-CM | POA: Diagnosis not present

## 2018-08-15 DIAGNOSIS — N329 Bladder disorder, unspecified: Secondary | ICD-10-CM | POA: Diagnosis not present

## 2018-08-15 DIAGNOSIS — N8189 Other female genital prolapse: Secondary | ICD-10-CM | POA: Diagnosis not present

## 2018-08-15 DIAGNOSIS — F419 Anxiety disorder, unspecified: Secondary | ICD-10-CM | POA: Diagnosis not present

## 2018-08-15 LAB — CBC
HCT: 33.2 % — ABNORMAL LOW (ref 36.0–46.0)
Hemoglobin: 10.3 g/dL — ABNORMAL LOW (ref 12.0–15.0)
MCH: 29.8 pg (ref 26.0–34.0)
MCHC: 31 g/dL (ref 30.0–36.0)
MCV: 96 fL (ref 80.0–100.0)
NRBC: 0 % (ref 0.0–0.2)
PLATELETS: 175 10*3/uL (ref 150–400)
RBC: 3.46 MIL/uL — ABNORMAL LOW (ref 3.87–5.11)
RDW: 13.1 % (ref 11.5–15.5)
WBC: 13 10*3/uL — ABNORMAL HIGH (ref 4.0–10.5)

## 2018-08-15 MED ORDER — OXYCODONE-ACETAMINOPHEN 7.5-325 MG PO TABS
1.0000 | ORAL_TABLET | ORAL | Status: DC | PRN
  Administered 2018-08-15: 1 via ORAL
  Filled 2018-08-15: qty 1

## 2018-08-15 MED ORDER — OXYCODONE-ACETAMINOPHEN 7.5-325 MG PO TABS: 1.0000 | ORAL_TABLET | ORAL | 0 refills | Status: AC | PRN

## 2018-08-15 NOTE — Discharge Summary (Signed)
NAME: ETHNE, JEON MEDICAL RECORD [id number] ACCOUNT [id number] DATE OF BIRTH:[birth year] FACILITY: [facility] LOCATION: [facility] PHYSICIAN:Lakisa Lotz Sherran Needs, MD  DISCHARGE SUMMARY  DATE OF DISCHARGE:  08/15/2018  ADMITTING DIAGNOSES:  Pelvic relaxation with stress urinary incontinence.  POSTOPERATIVE DIAGNOSIS:  Pelvic relaxation with stress urinary incontinence.  OPERATIVE PROCEDURE:  Laparoscopic-assisted vaginal hysterectomy, bilateral salpingo-oophorectomy.  Anterior and posterior colporrhaphy.  Sacrospinous ligament suspension.  Midurethral sling using a transobturator approach.  Cystoscopy.  For complete  History and Physical, please see dictated note.  HOSPITAL COURSE:  The patient underwent above-noted surgery.  Note that she did have a bladder papillary outgrowth.  This was reviewed by the urologist who felt that this needed followup.  We will arrange for followup cystoscopy in the urology  department.  Otherwise, surgery was uncomplicated.  Postoperatively, the patient did extremely well.  Her postop hemoglobin was 10.3, preop 13.4.  At the time of discharge, she was voiding without difficulty.  Her bladder was then instilled with 300 mL, and she voided all that out without difficulty.  She  was tolerating her diet.  Both umbilical incisions were intact.  She was having minimal spotting.  COMPLICATIONS:  None encountered during her stay in the hospital.  The patient discharged home in stable condition.  DISPOSITION:  The patient is to avoid heavy lifting, vaginal entrance or driving a car.  Instructed to call should there be heavy bleeding.  Any fever should be reported, nausea, vomiting, excessive pain or signs or symptoms of deep venous thrombosis and  pulmonary embolus.  She was discharged home on Percocet as needed for pain.  Will follow up in the office in approximately 1 week.  LN/NUANCE D:08/15/2018 T:08/15/2018 JOB:[id number]

## 2018-08-15 NOTE — Consult Note (Signed)
Urology Consult  CC: Referring physician: Arvella Nigh, MD Reason for referral:  Abnormal finding on cystoscopy  Impression/Assessment:  Bladder lesion:  The incidentally noted lesion within the patient's bladder who reportedly had no history of hematuria or symptoms suggestive of bladder irritation appeared whitish and benign in my opinion.  The fact that water flowing through the cystoscope did not dislodge this lesion would suggest it may have originated from the mucosa and may be some form of neoplastic lesion.  While a low-grade transitional cell carcinoma is certainly a possibility other benign etiologies such as an inverted papilloma were also entertained.  My recommendation, based on the images of the lesion and the fact the patient was not consented for any form of bladder surgery, was to evaluate this further as an outpatient.    Plan:   I will have her follow up with me as an outpatient.   History of Present Illness:  Karina Barnett is a 52 year old female patient of Dr. Radene Knee seen as an intraoperative consultation for an abnormal finding within the bladder at the time of cystoscopy.   She was undergoing a laparoscopic assisted vaginal hysterectomy, bilateral salpingo oophorectomy, anterior and posterior colporrhaphy, sacrospinous ligament suspension and mid urethral sling placement.  During the surgery diagnostic cystoscopy was performed in to evaluate for any form of bladder injury that had occurred during surgery as part of standard practice.  The cystoscopic evaluation revealed no injury to the bladder however on the floor of the bladder a whitish lesion was identified which at 1st was felt to possibly be some form of debris within the bladder however  heit was found to be adherent to the mucosa of the bladder by allowing water flow through the cystoscope to try and flush the debris.  Since it did not flush away this raised concern for some form of a neoplastic lesion and I was contacted  to evaluate the findings and render my opinion. As the procedure had moved to the laparoscopic portion confirmatory cystoscopy could not be performed at that time however photographs were taken of the lesion and these were printed out and reviewed.   The patient was under anesthesia throughout this time and therefore her history and review of systems was unobtainable.  Past Medical History:  Diagnosis Date  . Abnormal mammogram   . Abnormal Pap smear   . Anemia    during pregnancy  . Anxiety   . Cervical spine pain    C5-C6 compression  . Family history of adverse reaction to anesthesia    sister and mom have nase[place]  . Overweight (BMI 25.0-29.9)   . Vocal cord polyp    Past Surgical History:  Procedure Laterality Date  . COLONOSCOPY      Medications:  Scheduled: . docusate sodium  100 mg Oral BID  . HYDROmorphone   Intravenous Q4H   Continuous: . lactated ringers 125 mL/hr at 08/14/18 2000    Allergies:  Allergies  Allergen Reactions  . Flexeril [Cyclobenzaprine Hcl] Other (See Comments)    Causes severe joint pain  . Penicillins Rash    Has patient had a PCN reaction causing immediate rash, facial/tongue/throat swelling, SOB or lightheadedness with hypotension: Unknown Has patient had a PCN reaction causing severe rash involving mucus membranes or skin necrosis: No Has patient had a PCN reaction that required hospitalization: No Has patient had a PCN reaction occurring within the last 10 years: No If all of the above answers are "NO", then may proceed  with Cephalosporin use.     Family History  Problem Relation Age of Onset  . Hypertension Mother   . Irritable bowel syndrome Mother   . Other Mother        hx of hysterectomy in her late 40s-early 60s for prolapsed uterus  . Breast cancer Mother   . Arthritis Maternal Grandmother   . Alzheimer's disease Maternal Grandmother        d. 55y  . Hypertension Maternal Grandfather   . Heart disease Maternal  Grandfather   . Heart Problems Maternal Grandfather        open heart surgery  . Stroke Maternal Grandfather        d. 69y  . Skin cancer Maternal Grandfather        hx of skin burn; unspecified type  . Alzheimer's disease Paternal Grandmother   . Diabetes Paternal Grandmother        borderline  . Breast cancer Sister 76       DCIS  . Other Sister        dx benign grape-sized tumor of abdomen  . Lung cancer Paternal Grandfather        d. 54; tumor of bronchial arch; hx of chemical inhalation - worked in [employer]  . Other Other        icthyosis  . Breast cancer Cousin 56       paternal 1st cousin   . Pancreatic cancer Cousin 49       paternal 1st cousin d. 21y; smoker    Social History:  reports that she has never smoked. She has never used smokeless tobacco. She reports current alcohol use. She reports that she does not use drugs.  Review of Systems (10 point): Pertinent items are noted in HPI. A comprehensive review of systems was not obtainable  Physical Exam:  Vital signs in last 24 hours: Temp:  [97.3 F (36.3 C)-98.8 F (37.1 C)] 98.3 F (36.8 C) (12/20 0200) Pulse Rate:  [59-83] 65 (12/20 0200) Resp:  [9-22] 15 (12/20 0200) BP: (103-126)/(51-79) 103/51 (12/20 0200) SpO2:  [93 %-100 %] 97 % (12/20 0200)   Exam was limited to visualization of the abdomen as the patient was fully prepped and draped and under full anesthesia undergoing her laparoscopic surgery.  Laboratory Data:  Recent Labs    08/15/18 0336  WBC 13.0*  HGB 10.3*  HCT 33.2*   BMET No results for input(s): NA, K, CL, CO2, GLUCOSE, BUN, CREATININE, CALCIUM in the last 72 hours. No results for input(s): LABPT, INR in the last 72 hours. No results for input(s): LABURIN in the last 72 hours. No results found for this or any previous visit. Creatinine: Recent Labs    08/08/18 0834  CREATININE 0.72    Imaging: No results found.     Karina Barnett C 08/15/2018, 7:03 AM

## 2018-08-15 NOTE — Discharge Summary (Signed)
Patient name  Karina Barnett, Karina Barnett DICTATION# [id number] CSN#   Arvella Nigh, MD 08/15/2018 8:30 AM

## 2018-08-15 NOTE — Progress Notes (Signed)
Vaginal packing removed, patient having a moderate amount of vaginal bleeding. An amount of 350cc of irrigation was instilled into bladder before removal of catheter. Patient was able to void 429mL directly after catheter removal. Dr. Radene Knee called and updated. Foley to remain out.  Dilaudid PCA discontinued and patient has been bridged over to oral pain management. An amount of 16mL of dilaudid PCA wasted witnessed by Abigail Miyamoto, RN. Patient able to tolerate liquids and solids and is resting comfortably. Will continue to monitor patient. Lenna Sciara, RN

## 2018-08-15 NOTE — Progress Notes (Signed)
1 Day Post-Op Procedure(s) (LRB): LAPAROSCOPIC ASSISTED VAGINAL HYSTERECTOMY WITH SALPINGO OOPHORECTOMY (Bilateral) ANTERIOR AND POSTERIOR REPAIR WITH SACROSPINOUS FIXATION (N/A) transobturator sling (N/A) CYSTOSCOPY (N/A)  Subjective: Patient reports tolerating PO and no problems voiding.    Objective: I have reviewed patient's vital signs, intake and output and labs.  General: alert GI: soft, non-tender; bowel sounds normal; no masses,  no organomegaly Vaginal Bleeding: minimal  Assessment: s/p Procedure(s) with comments: LAPAROSCOPIC ASSISTED VAGINAL HYSTERECTOMY WITH SALPINGO OOPHORECTOMY (Bilateral) - need bed ANTERIOR AND POSTERIOR REPAIR WITH SACROSPINOUS FIXATION (N/A) transobturator sling (N/A) CYSTOSCOPY (N/A): stable  Plan: Discharge home  LOS: 0 days    Karina Barnett 08/15/2018, 8:25 AM

## 2018-08-16 NOTE — Anesthesia Postprocedure Evaluation (Signed)
Anesthesia Post Note  Patient: Karina Barnett  Procedure(s) Performed: LAPAROSCOPIC ASSISTED VAGINAL HYSTERECTOMY WITH SALPINGO OOPHORECTOMY (Bilateral ) ANTERIOR AND POSTERIOR REPAIR WITH SACROSPINOUS FIXATION (N/A ) transobturator sling (N/A ) CYSTOSCOPY (N/A )     Patient location during evaluation: Nursing Unit Anesthesia Type: General Level of consciousness: awake and alert Pain management: pain level controlled Vital Signs Assessment: post-procedure vital signs reviewed and stable Respiratory status: spontaneous breathing, nonlabored ventilation, respiratory function stable and patient connected to nasal cannula oxygen Cardiovascular status: blood pressure returned to baseline and stable Postop Assessment: no apparent nausea or vomiting Anesthetic complications: no    Last Vitals:  Vitals:   08/15/18 0600 08/15/18 0834  BP: 100/60 (!) 102/49  Pulse: (!) 55 (!) 54  Resp: 16   Temp: 36.8 C 37.1 C  SpO2: 95% 97%    Last Pain:  Vitals:   08/15/18 0834  TempSrc:   PainSc: 2                  Alexandria Shiflett COKER

## 2018-08-22 ENCOUNTER — Encounter (HOSPITAL_BASED_OUTPATIENT_CLINIC_OR_DEPARTMENT_OTHER): Payer: Self-pay | Admitting: Obstetrics and Gynecology

## 2018-08-25 DIAGNOSIS — N390 Urinary tract infection, site not specified: Secondary | ICD-10-CM | POA: Diagnosis not present

## 2018-09-03 DIAGNOSIS — D494 Neoplasm of unspecified behavior of bladder: Secondary | ICD-10-CM | POA: Diagnosis not present

## 2018-09-10 DIAGNOSIS — N760 Acute vaginitis: Secondary | ICD-10-CM | POA: Diagnosis not present

## 2018-09-10 DIAGNOSIS — Z09 Encounter for follow-up examination after completed treatment for conditions other than malignant neoplasm: Secondary | ICD-10-CM | POA: Diagnosis not present

## 2018-09-10 DIAGNOSIS — L089 Local infection of the skin and subcutaneous tissue, unspecified: Secondary | ICD-10-CM | POA: Diagnosis not present

## 2018-09-10 DIAGNOSIS — G8918 Other acute postprocedural pain: Secondary | ICD-10-CM | POA: Diagnosis not present

## 2018-09-15 DIAGNOSIS — Z4889 Encounter for other specified surgical aftercare: Secondary | ICD-10-CM | POA: Diagnosis not present

## 2018-10-09 ENCOUNTER — Other Ambulatory Visit: Payer: Self-pay | Admitting: Obstetrics and Gynecology

## 2018-10-09 DIAGNOSIS — Z9189 Other specified personal risk factors, not elsewhere classified: Secondary | ICD-10-CM

## 2018-12-17 DIAGNOSIS — E7800 Pure hypercholesterolemia, unspecified: Secondary | ICD-10-CM | POA: Diagnosis not present

## 2018-12-17 DIAGNOSIS — E559 Vitamin D deficiency, unspecified: Secondary | ICD-10-CM | POA: Diagnosis not present

## 2018-12-17 DIAGNOSIS — F413 Other mixed anxiety disorders: Secondary | ICD-10-CM | POA: Diagnosis not present

## 2019-01-29 DIAGNOSIS — Z1159 Encounter for screening for other viral diseases: Secondary | ICD-10-CM | POA: Diagnosis not present

## 2019-04-20 DIAGNOSIS — M9903 Segmental and somatic dysfunction of lumbar region: Secondary | ICD-10-CM | POA: Diagnosis not present

## 2019-04-20 DIAGNOSIS — M9905 Segmental and somatic dysfunction of pelvic region: Secondary | ICD-10-CM | POA: Diagnosis not present

## 2019-04-20 DIAGNOSIS — M9902 Segmental and somatic dysfunction of thoracic region: Secondary | ICD-10-CM | POA: Diagnosis not present

## 2019-04-20 DIAGNOSIS — M9901 Segmental and somatic dysfunction of cervical region: Secondary | ICD-10-CM | POA: Diagnosis not present

## 2019-05-21 DIAGNOSIS — Z20828 Contact with and (suspected) exposure to other viral communicable diseases: Secondary | ICD-10-CM | POA: Diagnosis not present

## 2019-06-16 DIAGNOSIS — Z9189 Other specified personal risk factors, not elsewhere classified: Secondary | ICD-10-CM | POA: Diagnosis not present

## 2019-06-16 DIAGNOSIS — Z6830 Body mass index (BMI) 30.0-30.9, adult: Secondary | ICD-10-CM | POA: Diagnosis not present

## 2019-06-16 DIAGNOSIS — Z01419 Encounter for gynecological examination (general) (routine) without abnormal findings: Secondary | ICD-10-CM | POA: Diagnosis not present

## 2019-06-17 DIAGNOSIS — Z01419 Encounter for gynecological examination (general) (routine) without abnormal findings: Secondary | ICD-10-CM | POA: Diagnosis not present

## 2019-06-19 ENCOUNTER — Other Ambulatory Visit: Payer: Self-pay | Admitting: Obstetrics and Gynecology

## 2019-06-19 DIAGNOSIS — Z1231 Encounter for screening mammogram for malignant neoplasm of breast: Secondary | ICD-10-CM

## 2019-06-22 ENCOUNTER — Other Ambulatory Visit: Payer: Self-pay | Admitting: Registered"

## 2019-06-22 DIAGNOSIS — Z20822 Contact with and (suspected) exposure to covid-19: Secondary | ICD-10-CM

## 2019-06-23 ENCOUNTER — Other Ambulatory Visit: Payer: Self-pay | Admitting: Obstetrics and Gynecology

## 2019-06-23 DIAGNOSIS — Z9189 Other specified personal risk factors, not elsewhere classified: Secondary | ICD-10-CM

## 2019-06-23 LAB — NOVEL CORONAVIRUS, NAA: SARS-CoV-2, NAA: NOT DETECTED

## 2019-07-08 DIAGNOSIS — F4322 Adjustment disorder with anxiety: Secondary | ICD-10-CM | POA: Diagnosis not present

## 2019-07-16 DIAGNOSIS — Z0000 Encounter for general adult medical examination without abnormal findings: Secondary | ICD-10-CM | POA: Diagnosis not present

## 2019-07-16 DIAGNOSIS — E7800 Pure hypercholesterolemia, unspecified: Secondary | ICD-10-CM | POA: Diagnosis not present

## 2019-07-16 DIAGNOSIS — D649 Anemia, unspecified: Secondary | ICD-10-CM | POA: Diagnosis not present

## 2019-07-26 DIAGNOSIS — Z20828 Contact with and (suspected) exposure to other viral communicable diseases: Secondary | ICD-10-CM | POA: Diagnosis not present

## 2019-08-05 DIAGNOSIS — H16042 Marginal corneal ulcer, left eye: Secondary | ICD-10-CM | POA: Diagnosis not present

## 2019-08-07 DIAGNOSIS — H16042 Marginal corneal ulcer, left eye: Secondary | ICD-10-CM | POA: Diagnosis not present

## 2019-08-10 ENCOUNTER — Ambulatory Visit: Payer: BLUE CROSS/BLUE SHIELD

## 2019-08-14 DIAGNOSIS — D2262 Melanocytic nevi of left upper limb, including shoulder: Secondary | ICD-10-CM | POA: Diagnosis not present

## 2019-08-14 DIAGNOSIS — Z85828 Personal history of other malignant neoplasm of skin: Secondary | ICD-10-CM | POA: Diagnosis not present

## 2019-08-14 DIAGNOSIS — D1801 Hemangioma of skin and subcutaneous tissue: Secondary | ICD-10-CM | POA: Diagnosis not present

## 2019-08-14 DIAGNOSIS — D2261 Melanocytic nevi of right upper limb, including shoulder: Secondary | ICD-10-CM | POA: Diagnosis not present

## 2019-09-23 ENCOUNTER — Ambulatory Visit
Admission: RE | Admit: 2019-09-23 | Discharge: 2019-09-23 | Disposition: A | Discharge status: Home / Self Care | Payer: BC Managed Care – PPO | Source: Ambulatory Visit | Attending: Obstetrics and Gynecology | Admitting: Obstetrics and Gynecology

## 2019-09-23 ENCOUNTER — Other Ambulatory Visit: Payer: Self-pay

## 2019-09-23 DIAGNOSIS — Z1231 Encounter for screening mammogram for malignant neoplasm of breast: Secondary | ICD-10-CM

## 2020-03-29 IMAGING — MG DIGITAL SCREENING BILATERAL MAMMOGRAM WITH TOMO AND CAD
8 series · 8 of 24 positions shown · non-contrast
Comparison: Previous exam(s).

CLINICAL DATA: Screening.

EXAM:
DIGITAL SCREENING BILATERAL MAMMOGRAM WITH TOMO AND CAD

[R CC synth-2D]
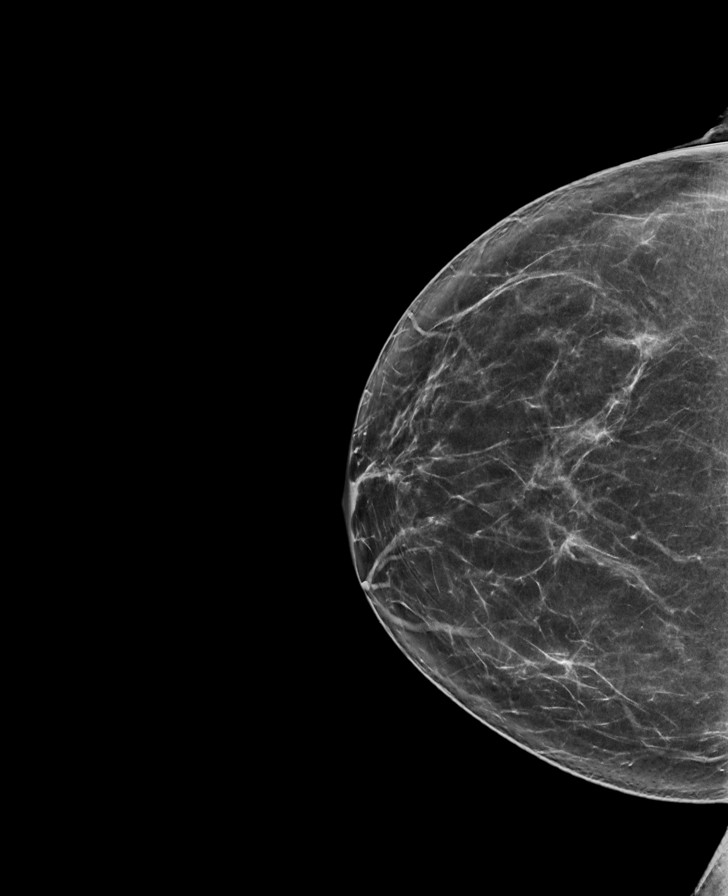

[L CC synth-2D]
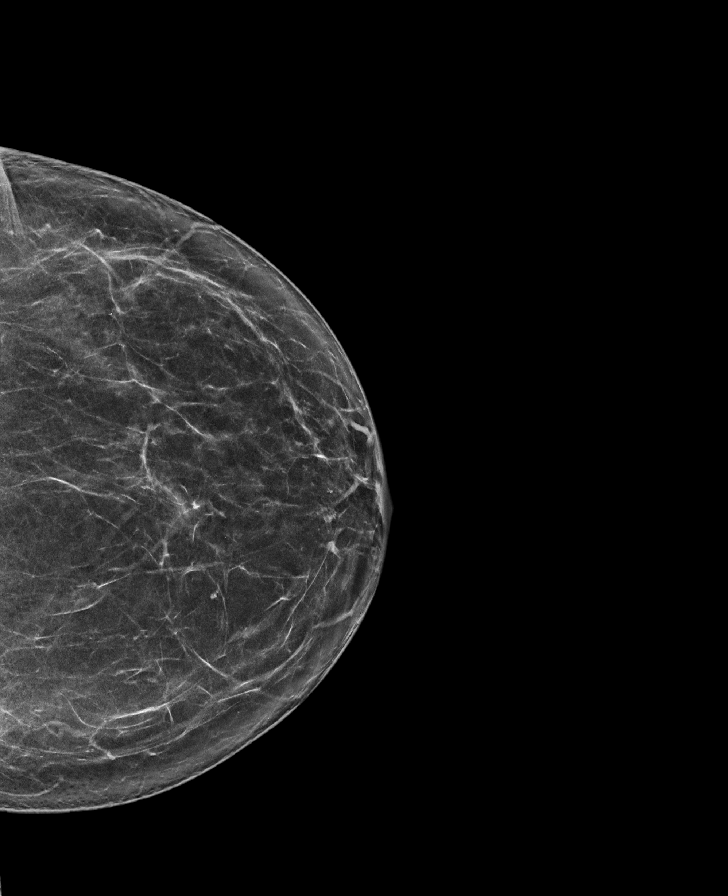

[L MLO synth-2D]
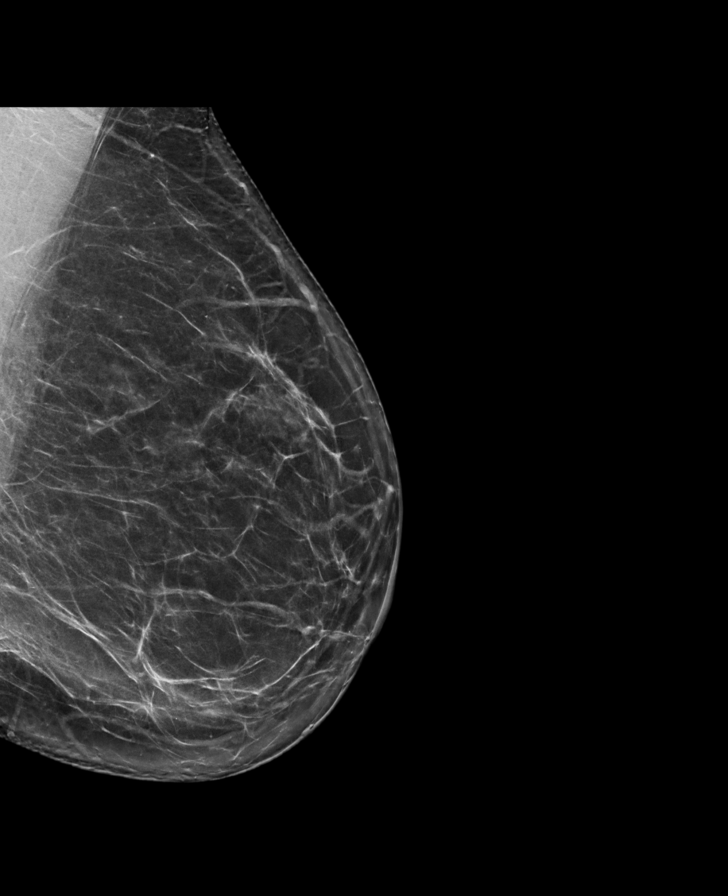

[R MLO synth-2D]
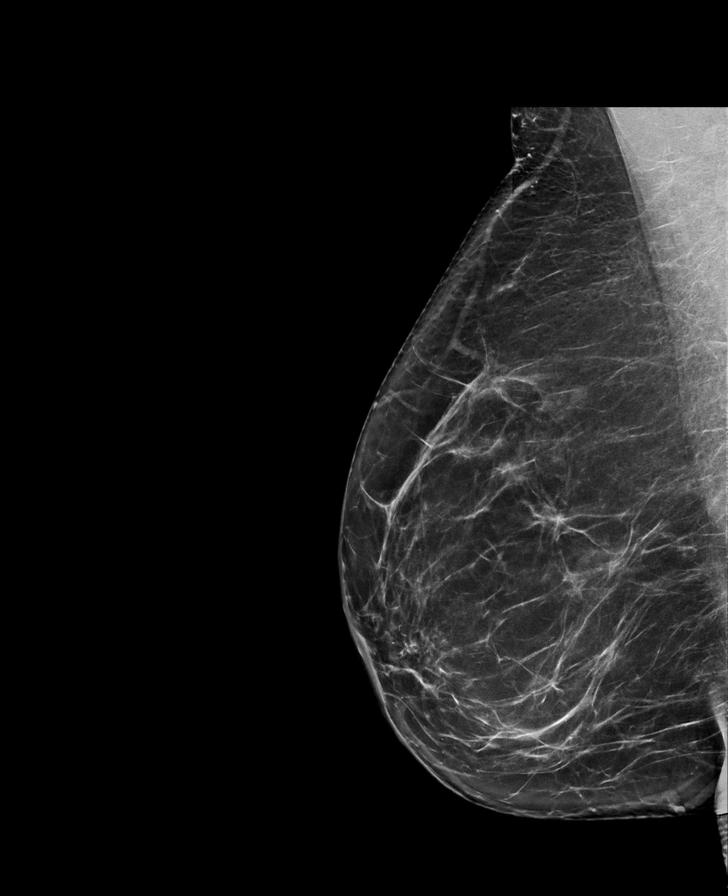

[L MLO tomo · tomo slice 41/82.0]
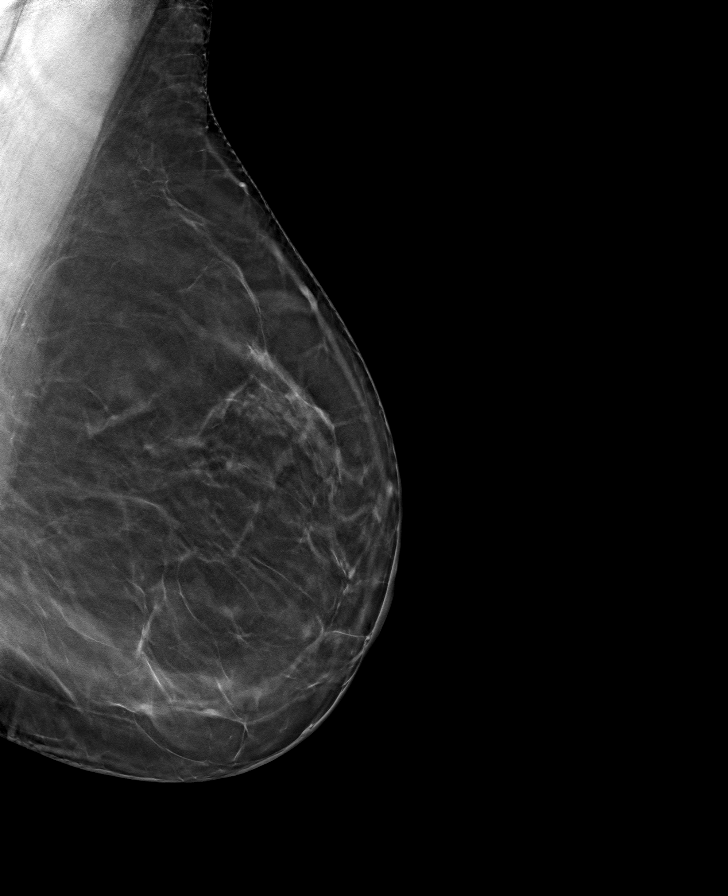

[R CC tomo · tomo slice 41/82.0]
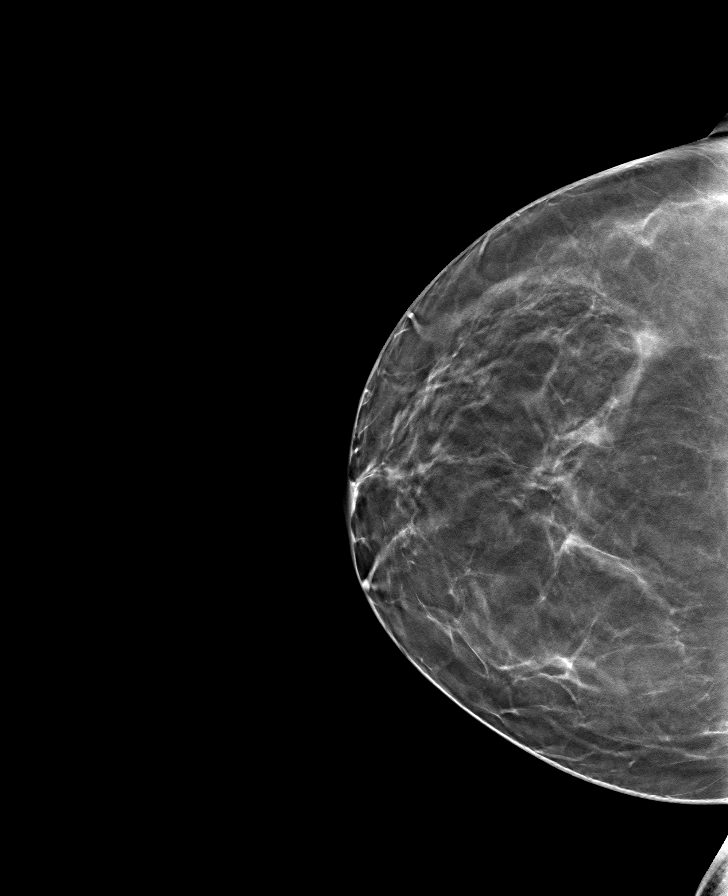

[L CC tomo · tomo slice 39/77.0]
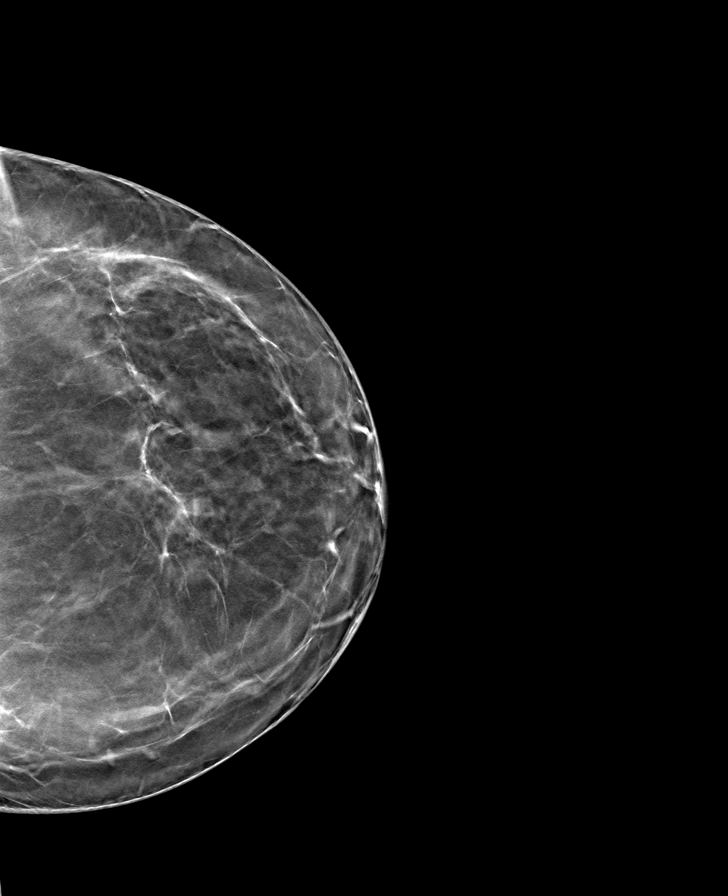

[R MLO tomo · tomo slice 43/86.0]
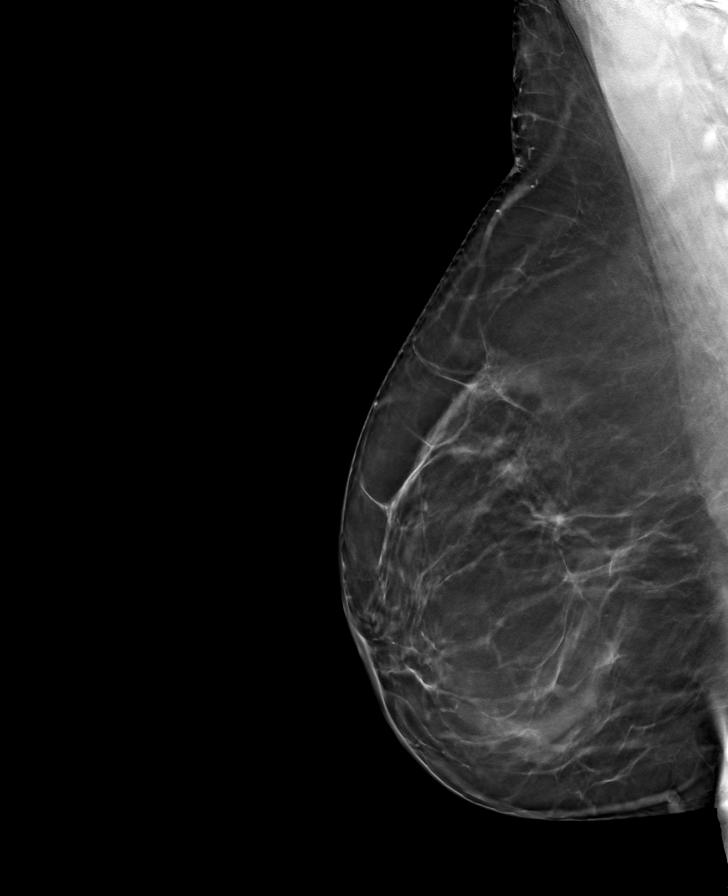

[8 of 24 positions shown; findings below may reference images not displayed]

ACR Breast Density Category b: There are scattered areas of
fibroglandular density.
FINDINGS: There are no findings suspicious for malignancy. Images were
processed with CAD.
IMPRESSION: No mammographic evidence of malignancy. A result letter of this
screening mammogram will be mailed directly to the patient.

RECOMMENDATION:
Screening mammogram in one year. (Code:[id number])

BI-RADS CATEGORY  1: Negative.

## 2020-04-26 ENCOUNTER — Encounter: Payer: Self-pay | Admitting: Genetic Counselor

## 2020-06-01 DIAGNOSIS — Z03818 Encounter for observation for suspected exposure to other biological agents ruled out: Secondary | ICD-10-CM | POA: Diagnosis not present

## 2020-08-02 DIAGNOSIS — Z0000 Encounter for general adult medical examination without abnormal findings: Secondary | ICD-10-CM | POA: Diagnosis not present

## 2020-08-02 DIAGNOSIS — Z23 Encounter for immunization: Secondary | ICD-10-CM | POA: Diagnosis not present

## 2020-08-02 DIAGNOSIS — E7800 Pure hypercholesterolemia, unspecified: Secondary | ICD-10-CM | POA: Diagnosis not present

## 2020-08-15 DIAGNOSIS — Z01419 Encounter for gynecological examination (general) (routine) without abnormal findings: Secondary | ICD-10-CM | POA: Diagnosis not present

## 2020-08-15 DIAGNOSIS — Z13820 Encounter for screening for osteoporosis: Secondary | ICD-10-CM | POA: Diagnosis not present

## 2020-08-15 DIAGNOSIS — Z6830 Body mass index (BMI) 30.0-30.9, adult: Secondary | ICD-10-CM | POA: Diagnosis not present

## 2020-08-15 LAB — HM DEXA SCAN

## 2020-08-16 DIAGNOSIS — D2262 Melanocytic nevi of left upper limb, including shoulder: Secondary | ICD-10-CM | POA: Diagnosis not present

## 2020-08-16 DIAGNOSIS — L820 Inflamed seborrheic keratosis: Secondary | ICD-10-CM | POA: Diagnosis not present

## 2020-08-16 DIAGNOSIS — D2261 Melanocytic nevi of right upper limb, including shoulder: Secondary | ICD-10-CM | POA: Diagnosis not present

## 2020-08-16 DIAGNOSIS — Z85828 Personal history of other malignant neoplasm of skin: Secondary | ICD-10-CM | POA: Diagnosis not present

## 2020-08-16 DIAGNOSIS — D224 Melanocytic nevi of scalp and neck: Secondary | ICD-10-CM | POA: Diagnosis not present

## 2020-09-16 ENCOUNTER — Other Ambulatory Visit: Payer: Self-pay | Admitting: Obstetrics and Gynecology

## 2020-09-16 DIAGNOSIS — Z1231 Encounter for screening mammogram for malignant neoplasm of breast: Secondary | ICD-10-CM

## 2020-09-22 DIAGNOSIS — E7800 Pure hypercholesterolemia, unspecified: Secondary | ICD-10-CM | POA: Diagnosis not present

## 2020-09-27 ENCOUNTER — Ambulatory Visit: Payer: BC Managed Care – PPO

## 2020-11-10 ENCOUNTER — Ambulatory Visit: Payer: Self-pay

## 2020-12-21 DIAGNOSIS — Z1231 Encounter for screening mammogram for malignant neoplasm of breast: Secondary | ICD-10-CM | POA: Diagnosis not present

## 2020-12-28 ENCOUNTER — Ambulatory Visit: Payer: Self-pay

## 2021-08-22 DIAGNOSIS — D3617 Benign neoplasm of peripheral nerves and autonomic nervous system of trunk, unspecified: Secondary | ICD-10-CM | POA: Diagnosis not present

## 2021-08-22 DIAGNOSIS — Z85828 Personal history of other malignant neoplasm of skin: Secondary | ICD-10-CM | POA: Diagnosis not present

## 2021-08-22 DIAGNOSIS — D2262 Melanocytic nevi of left upper limb, including shoulder: Secondary | ICD-10-CM | POA: Diagnosis not present

## 2021-08-22 DIAGNOSIS — D2261 Melanocytic nevi of right upper limb, including shoulder: Secondary | ICD-10-CM | POA: Diagnosis not present

## 2021-08-22 DIAGNOSIS — D2271 Melanocytic nevi of right lower limb, including hip: Secondary | ICD-10-CM | POA: Diagnosis not present

## 2021-08-22 DIAGNOSIS — D225 Melanocytic nevi of trunk: Secondary | ICD-10-CM | POA: Diagnosis not present

## 2021-08-22 DIAGNOSIS — D485 Neoplasm of uncertain behavior of skin: Secondary | ICD-10-CM | POA: Diagnosis not present

## 2021-09-07 DIAGNOSIS — M9901 Segmental and somatic dysfunction of cervical region: Secondary | ICD-10-CM | POA: Diagnosis not present

## 2021-09-07 DIAGNOSIS — M9905 Segmental and somatic dysfunction of pelvic region: Secondary | ICD-10-CM | POA: Diagnosis not present

## 2021-09-07 DIAGNOSIS — M722 Plantar fascial fibromatosis: Secondary | ICD-10-CM | POA: Diagnosis not present

## 2021-09-07 DIAGNOSIS — M9902 Segmental and somatic dysfunction of thoracic region: Secondary | ICD-10-CM | POA: Diagnosis not present

## 2021-09-07 DIAGNOSIS — M9903 Segmental and somatic dysfunction of lumbar region: Secondary | ICD-10-CM | POA: Diagnosis not present

## 2021-09-13 DIAGNOSIS — M9901 Segmental and somatic dysfunction of cervical region: Secondary | ICD-10-CM | POA: Diagnosis not present

## 2021-09-13 DIAGNOSIS — M722 Plantar fascial fibromatosis: Secondary | ICD-10-CM | POA: Diagnosis not present

## 2021-09-13 DIAGNOSIS — M9903 Segmental and somatic dysfunction of lumbar region: Secondary | ICD-10-CM | POA: Diagnosis not present

## 2021-09-13 DIAGNOSIS — M9905 Segmental and somatic dysfunction of pelvic region: Secondary | ICD-10-CM | POA: Diagnosis not present

## 2021-09-13 DIAGNOSIS — M9902 Segmental and somatic dysfunction of thoracic region: Secondary | ICD-10-CM | POA: Diagnosis not present

## 2021-09-20 DIAGNOSIS — M9902 Segmental and somatic dysfunction of thoracic region: Secondary | ICD-10-CM | POA: Diagnosis not present

## 2021-09-20 DIAGNOSIS — M9901 Segmental and somatic dysfunction of cervical region: Secondary | ICD-10-CM | POA: Diagnosis not present

## 2021-09-20 DIAGNOSIS — M9903 Segmental and somatic dysfunction of lumbar region: Secondary | ICD-10-CM | POA: Diagnosis not present

## 2021-09-20 DIAGNOSIS — M9905 Segmental and somatic dysfunction of pelvic region: Secondary | ICD-10-CM | POA: Diagnosis not present

## 2021-09-20 DIAGNOSIS — M722 Plantar fascial fibromatosis: Secondary | ICD-10-CM | POA: Diagnosis not present

## 2021-09-27 DIAGNOSIS — M722 Plantar fascial fibromatosis: Secondary | ICD-10-CM | POA: Diagnosis not present

## 2021-09-27 DIAGNOSIS — M9905 Segmental and somatic dysfunction of pelvic region: Secondary | ICD-10-CM | POA: Diagnosis not present

## 2021-09-27 DIAGNOSIS — M9903 Segmental and somatic dysfunction of lumbar region: Secondary | ICD-10-CM | POA: Diagnosis not present

## 2021-09-27 DIAGNOSIS — M9901 Segmental and somatic dysfunction of cervical region: Secondary | ICD-10-CM | POA: Diagnosis not present

## 2021-09-27 DIAGNOSIS — M9902 Segmental and somatic dysfunction of thoracic region: Secondary | ICD-10-CM | POA: Diagnosis not present

## 2021-09-29 DIAGNOSIS — M9902 Segmental and somatic dysfunction of thoracic region: Secondary | ICD-10-CM | POA: Diagnosis not present

## 2021-09-29 DIAGNOSIS — M722 Plantar fascial fibromatosis: Secondary | ICD-10-CM | POA: Diagnosis not present

## 2021-09-29 DIAGNOSIS — M9901 Segmental and somatic dysfunction of cervical region: Secondary | ICD-10-CM | POA: Diagnosis not present

## 2021-09-29 DIAGNOSIS — M9903 Segmental and somatic dysfunction of lumbar region: Secondary | ICD-10-CM | POA: Diagnosis not present

## 2021-09-29 DIAGNOSIS — M9905 Segmental and somatic dysfunction of pelvic region: Secondary | ICD-10-CM | POA: Diagnosis not present

## 2021-10-03 DIAGNOSIS — M7071 Other bursitis of hip, right hip: Secondary | ICD-10-CM | POA: Diagnosis not present

## 2021-10-03 DIAGNOSIS — M9905 Segmental and somatic dysfunction of pelvic region: Secondary | ICD-10-CM | POA: Diagnosis not present

## 2021-10-03 DIAGNOSIS — M9903 Segmental and somatic dysfunction of lumbar region: Secondary | ICD-10-CM | POA: Diagnosis not present

## 2021-10-03 DIAGNOSIS — M9902 Segmental and somatic dysfunction of thoracic region: Secondary | ICD-10-CM | POA: Diagnosis not present

## 2021-10-03 DIAGNOSIS — M9901 Segmental and somatic dysfunction of cervical region: Secondary | ICD-10-CM | POA: Diagnosis not present

## 2021-10-12 DIAGNOSIS — Z1151 Encounter for screening for human papillomavirus (HPV): Secondary | ICD-10-CM | POA: Diagnosis not present

## 2021-10-12 DIAGNOSIS — Z124 Encounter for screening for malignant neoplasm of cervix: Secondary | ICD-10-CM | POA: Diagnosis not present

## 2021-10-12 DIAGNOSIS — Z01419 Encounter for gynecological examination (general) (routine) without abnormal findings: Secondary | ICD-10-CM | POA: Diagnosis not present

## 2021-10-12 DIAGNOSIS — Z6832 Body mass index (BMI) 32.0-32.9, adult: Secondary | ICD-10-CM | POA: Diagnosis not present

## 2021-10-12 LAB — HM PAP SMEAR

## 2021-10-12 LAB — RESULTS CONSOLE HPV: CHL HPV: NEGATIVE

## 2021-10-16 ENCOUNTER — Other Ambulatory Visit: Payer: Self-pay | Admitting: Obstetrics and Gynecology

## 2021-10-16 DIAGNOSIS — Z8249 Family history of ischemic heart disease and other diseases of the circulatory system: Secondary | ICD-10-CM

## 2022-01-10 DIAGNOSIS — Z1231 Encounter for screening mammogram for malignant neoplasm of breast: Secondary | ICD-10-CM | POA: Diagnosis not present

## 2022-01-10 LAB — HM MAMMOGRAPHY

## 2022-04-05 LAB — HEMOGLOBIN A1C: Hemoglobin A1C: 5.5

## 2022-04-05 LAB — LIPID PANEL
Cholesterol: 263 — AB (ref 0–200)
HDL: 92 — AB (ref 35–70)
LDL Cholesterol: 149
LDl/HDL Ratio: 1.6
Triglycerides: 111 (ref 40–160)

## 2022-04-05 LAB — HEPATIC FUNCTION PANEL
ALT: 23 U/L (ref 7–35)
AST: 24 (ref 13–35)
Alkaline Phosphatase: 92 (ref 25–125)
Bilirubin, Total: 0.4

## 2022-04-05 LAB — BASIC METABOLIC PANEL
BUN: 14 (ref 4–21)
Creatinine: 0.9 (ref 0.5–1.1)
Glucose: 60

## 2022-04-05 LAB — COMPREHENSIVE METABOLIC PANEL
Albumin: 4.9 (ref 3.5–5.0)
Globulin: 1.9

## 2022-04-05 LAB — PROTEIN / CREATININE RATIO, URINE: Creatinine, Urine: 14

## 2022-04-05 LAB — MICROALBUMIN, URINE: Microalb, Ur: 0

## 2022-04-13 DIAGNOSIS — E669 Obesity, unspecified: Secondary | ICD-10-CM | POA: Diagnosis not present

## 2022-04-13 DIAGNOSIS — E785 Hyperlipidemia, unspecified: Secondary | ICD-10-CM | POA: Diagnosis not present

## 2022-04-13 DIAGNOSIS — Z6831 Body mass index (BMI) 31.0-31.9, adult: Secondary | ICD-10-CM | POA: Diagnosis not present

## 2022-04-13 DIAGNOSIS — Z713 Dietary counseling and surveillance: Secondary | ICD-10-CM | POA: Diagnosis not present

## 2022-04-18 DIAGNOSIS — Z713 Dietary counseling and surveillance: Secondary | ICD-10-CM | POA: Diagnosis not present

## 2022-04-18 DIAGNOSIS — E785 Hyperlipidemia, unspecified: Secondary | ICD-10-CM | POA: Diagnosis not present

## 2022-04-18 DIAGNOSIS — E669 Obesity, unspecified: Secondary | ICD-10-CM | POA: Diagnosis not present

## 2022-04-18 DIAGNOSIS — Z6831 Body mass index (BMI) 31.0-31.9, adult: Secondary | ICD-10-CM | POA: Diagnosis not present

## 2022-05-07 DIAGNOSIS — Z713 Dietary counseling and surveillance: Secondary | ICD-10-CM | POA: Diagnosis not present

## 2022-05-07 DIAGNOSIS — Z6831 Body mass index (BMI) 31.0-31.9, adult: Secondary | ICD-10-CM | POA: Diagnosis not present

## 2022-05-07 DIAGNOSIS — E669 Obesity, unspecified: Secondary | ICD-10-CM | POA: Diagnosis not present

## 2022-05-07 DIAGNOSIS — E785 Hyperlipidemia, unspecified: Secondary | ICD-10-CM | POA: Diagnosis not present

## 2022-05-14 DIAGNOSIS — Z6831 Body mass index (BMI) 31.0-31.9, adult: Secondary | ICD-10-CM | POA: Diagnosis not present

## 2022-05-14 DIAGNOSIS — E785 Hyperlipidemia, unspecified: Secondary | ICD-10-CM | POA: Diagnosis not present

## 2022-05-14 DIAGNOSIS — E669 Obesity, unspecified: Secondary | ICD-10-CM | POA: Diagnosis not present

## 2022-05-14 DIAGNOSIS — Z713 Dietary counseling and surveillance: Secondary | ICD-10-CM | POA: Diagnosis not present

## 2022-05-28 DIAGNOSIS — Z6831 Body mass index (BMI) 31.0-31.9, adult: Secondary | ICD-10-CM | POA: Diagnosis not present

## 2022-05-28 DIAGNOSIS — E669 Obesity, unspecified: Secondary | ICD-10-CM | POA: Diagnosis not present

## 2022-05-28 DIAGNOSIS — Z713 Dietary counseling and surveillance: Secondary | ICD-10-CM | POA: Diagnosis not present

## 2022-05-28 DIAGNOSIS — E785 Hyperlipidemia, unspecified: Secondary | ICD-10-CM | POA: Diagnosis not present

## 2022-06-13 ENCOUNTER — Ambulatory Visit: Payer: BC Managed Care – PPO | Admitting: Primary Care

## 2022-06-13 ENCOUNTER — Encounter: Payer: Self-pay | Admitting: Primary Care

## 2022-06-13 VITALS — BP 116/72 | HR 75 | Temp 97.9°F | Ht 66.5 in | Wt 200.0 lb

## 2022-06-13 DIAGNOSIS — E559 Vitamin D deficiency, unspecified: Secondary | ICD-10-CM

## 2022-06-13 DIAGNOSIS — Z23 Encounter for immunization: Secondary | ICD-10-CM | POA: Diagnosis not present

## 2022-06-13 DIAGNOSIS — F411 Generalized anxiety disorder: Secondary | ICD-10-CM | POA: Diagnosis not present

## 2022-06-13 DIAGNOSIS — Z803 Family history of malignant neoplasm of breast: Secondary | ICD-10-CM | POA: Diagnosis not present

## 2022-06-13 DIAGNOSIS — E785 Hyperlipidemia, unspecified: Secondary | ICD-10-CM

## 2022-06-13 DIAGNOSIS — Z1211 Encounter for screening for malignant neoplasm of colon: Secondary | ICD-10-CM

## 2022-06-13 MED ORDER — ZOSTER VAC RECOMB ADJUVANTED 50 MCG/0.5ML IM SUSR: 0.5000 mL | Freq: Once | INTRAMUSCULAR | 1 refills | Status: AC

## 2022-06-13 NOTE — Patient Instructions (Signed)
You will be contacted regarding your referral to GI for the colonoscopy.  Please let us know if you have not been contacted within two weeks.   Take the Shingles vaccine to the pharmacy.  Schedule a lab only appointment for February 2024 to repeat your cholesterol level.  It was a pleasure to see you today!

## 2022-06-13 NOTE — Assessment & Plan Note (Signed)
In mother and sister. Mammogram UTD. Follows with GYN.

## 2022-06-13 NOTE — Assessment & Plan Note (Signed)
Reviewed lipid panel from August 2023.  Commended her on dietary changes, encouraged regular exercise. Repeat lipids around February 2024. She agrees.

## 2022-06-13 NOTE — Progress Notes (Signed)
Subjective:    Patient ID: Karina Barnett, female    DOB: [birth year], 56 y.o.   MRN: [id number]  HPI  Karina Barnett is a very pleasant 56 y.o. female who presents today who presents today to establish care and discuss the problems mentioned below. Will obtain/review records.  She is due for repeat colonoscopy. Her mammogram is UTD, follows with GYN.  She is also due for tetanus vaccine and Shingrix vaccines.  1) GAD: Chronic over the years. Typically manages anxiety through prayer, exercise. She is working with a Engineer, maintenance (IT) through [employer]. 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   She has a lot of support from her husband. Previously managed on Xanax 0.25 mg for which she took sparingly at bedtime. She has recently been taking Magnesium.   2) Hyperlipidemia: Chronic for years. She underwent lipid panel in August 2023 per GYN with LDL of 149 which was increased from 110's 8 months prior.   Family history of CAD in her father who has undergone CABG. She denies chest pain, shortness of breath.   3) Cold Sores: Chronic history, infrequent breakouts occurring once to twice annually. She is managed on acyclovir 200 mg BID PRN. Uses sparingly as she has infrequent outbreaks.   4) Vitamin D Deficiency: Currently managed on vitamin D 50,000 IU once weekly and then 1000 IU daily. Most recent vitamin D level was 27 in June 2023 per her employer. She has about three weeks remaining on her 50,000 IU prescription.   Review of Systems  Respiratory:  Negative for shortness of breath.   Cardiovascular:  Negative for chest pain.  Neurological:  Negative for dizziness and headaches.  Psychiatric/Behavioral:  The patient is nervous/anxious.          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  The Hereditary Gene Panel offered by [facility] includes sequencing and/or deletion duplication testing of the following 43 genes: APC, ATM, AXIN2, BARD1, BMPR1A, BRCA1, BRCA2, BRIP1, CDH1, CDKN2A (p14ARF), CDKN2A (p16INK4a), CHEK2, DICER1, EPCAM (Deletion/duplication testing only), GREM1 (promoter region deletion/duplication testing on   H/O Bell's palsy 11/06/2011   In setting of FUO   Left knee pain 05/26/2014   Overweight (BMI 25.0-29.9)    S/P laparoscopic assisted vaginal hysterectomy (LAVH) 08/14/2018   Vocal cord polyp     Social History   Socioeconomic History   Marital status: Single    Spouse name: Not on file   Number of children: Not on file   Years of education: Not on file   Highest education level: Not on file  Occupational History   Not on file  Tobacco Use   Smoking status: Never   Smokeless tobacco: Never  Vaping Use   Vaping Use: Never used  Substance and Sexual Activity   Alcohol use: Yes    Comment: social, occasional   Drug use: No   Sexual activity: Yes    Birth control/protection: I.U.D.  Other Topics  Concern   Not on file  Social History Narrative   Not on file   Social Determinants of Health   Financial Resource Strain: Not on file  Food Insecurity: Not on file  Transportation Needs: Not on file  Physical Activity: Not on file  Stress: Not on file  Social Connections: Not on file  Intimate Partner Violence: Not on file    Past Surgical History:  Procedure Laterality Date   ANTERIOR AND POSTERIOR REPAIR WITH SACROSPINOUS FIXATION N/A 08/14/2018   Procedure: ANTERIOR AND POSTERIOR REPAIR WITH SACROSPINOUS FIXATION;  Surgeon:  Arvella Nigh, MD;  Location: [facility];  Service: Gynecology;  Laterality: N/A;   BLADDER SUSPENSION N/A 08/14/2018   Procedure: transobturator sling;  Surgeon: Arvella Nigh, MD;  Location: [facility];  Service: Gynecology;  Laterality: N/A;   COLONOSCOPY     CYSTOSCOPY N/A 08/14/2018   Procedure: CYSTOSCOPY;  Surgeon: Arvella Nigh, MD;  Location: [facility];  Service: Gynecology;  Laterality: N/A;   LAPAROSCOPIC VAGINAL HYSTERECTOMY WITH SALPINGO OOPHORECTOMY Bilateral 08/14/2018   Procedure: LAPAROSCOPIC ASSISTED VAGINAL HYSTERECTOMY WITH SALPINGO OOPHORECTOMY;  Surgeon: Arvella Nigh, MD;  Location: [facility];  Service: Gynecology;  Laterality: Bilateral;  need bed    Family History  Problem Relation Age of Onset   Hypertension Mother    Irritable bowel syndrome Mother    Other Mother        hx of hysterectomy in her late 24s-early 60s for prolapsed uterus   Breast cancer Mother    CAD Father    Breast cancer Sister 62       DCIS   Other Sister        dx benign grape-sized tumor of abdomen   Arthritis Maternal Grandmother    Alzheimer's disease Maternal Grandmother        d. 80y   Hypertension Maternal Grandfather    Heart disease Maternal Grandfather    Heart Problems Maternal Grandfather        open heart surgery   Stroke Maternal Grandfather        d. 10y   Skin cancer Maternal Grandfather        hx of skin burn; unspecified type   Alzheimer's disease Paternal Grandmother    Diabetes Paternal Grandmother        borderline   Lung cancer Paternal Grandfather        d. 29; tumor of bronchial arch; hx of chemical inhalation - worked in [place] 40       paternal 1st cousin    Pancreatic cancer Cousin 66       paternal 1st cousin d. 71y; smoker   Other Other        icthyosis    Allergies  Allergen Reactions   Flexeril [Cyclobenzaprine Hcl] Other (See Comments)    Causes severe  joint pain   Penicillins Rash    Has patient had a PCN reaction causing immediate rash, facial/tongue/throat swelling, SOB or lightheadedness with hypotension: Unknown Has patient had a PCN reaction causing severe rash involving mucus membranes or skin necrosis: No Has patient had a PCN reaction that required hospitalization: No Has patient had a PCN reaction occurring within the last 10 years: No If all of the above answers are "NO", then may proceed with Cephalosporin use.     Current Outpatient Medications on File Prior to Visit  Medication Sig Dispense Refill   acyclovir (ZOVIRAX) 200 MG capsule Take  1 capsule (200 mg total) by mouth 2 (two) times daily as needed. (Patient taking differently: Take 200 mg by mouth 2 (two) times daily as needed (fever blisters).) 10 capsule 1   cholecalciferol (VITAMIN D3) 25 MCG (1000 UT) tablet Take 50,000 Units by mouth once a week.     ALPRAZolam (XANAX) 0.25 MG tablet Take 1 tablet (0.25 mg total) by mouth at bedtime as needed. (Patient not taking: Reported on 06/13/2022) 30 tablet 1   Omega-3 1000 MG CAPS Take 1 capsule by mouth daily. (Patient not taking: Reported on 06/13/2022)     No current facility-administered medications on file prior to visit.    BP 116/72   Pulse 75   Temp 97.9 F (36.6 C) (Temporal)   Ht 5' 6.5" (1.689 m)   Wt 200 lb (90.7 kg)   SpO2 97%   BMI 31.80 kg/m  Objective:   Physical Exam Cardiovascular:     Rate and Rhythm: Normal rate and regular rhythm.  Pulmonary:     Effort: Pulmonary effort is normal.     Breath sounds: Normal breath sounds.  Musculoskeletal:     Cervical back: Neck supple.  Skin:    General: Skin is warm and dry.  Psychiatric:        Mood and Affect: Mood normal.           Assessment & Plan:   Problem List Items Addressed This Visit       Other   GAD (generalized anxiety disorder)    Chronic, able to handle on her own.  Continue PRN Xanax 0.25 mg for which she uses sparingly.    Continue to monitor.       Hyperlipidemia - Primary    Reviewed lipid panel from August 2023.  Commended her on dietary changes, encouraged regular exercise. Repeat lipids around February 2024. She agrees.        Vitamin D deficiency    Continue vitamin D 50,000 IU weekly until complete. Continue vitamin D 1000 IU daily.  Repeat labs will be completed through her employer.       Family history of breast cancer in first degree relative    In mother and sister. Mammogram UTD. Follows with GYN.      Other Visit Diagnoses     Need for shingles vaccine       Relevant Medications   Zoster Vaccine Adjuvanted [facility]) injection   Screening for colon cancer       Relevant Orders   Ambulatory referral to Gastroenterology   Need for Tdap vaccination       Relevant Orders   Tdap vaccine greater than or equal to 7yo IM (Completed)          Pleas Koch, NP

## 2022-06-13 NOTE — Assessment & Plan Note (Signed)
Continue vitamin D 50,000 IU weekly until complete. Continue vitamin D 1000 IU daily.  Repeat labs will be completed through her employer.

## 2022-06-13 NOTE — Assessment & Plan Note (Signed)
Chronic, able to handle on her own.  Continue PRN Xanax 0.25 mg for which she uses sparingly.   Continue to monitor.

## 2022-06-18 ENCOUNTER — Encounter: Payer: Self-pay | Admitting: Primary Care

## 2022-06-18 DIAGNOSIS — Z6831 Body mass index (BMI) 31.0-31.9, adult: Secondary | ICD-10-CM | POA: Diagnosis not present

## 2022-06-18 DIAGNOSIS — Z713 Dietary counseling and surveillance: Secondary | ICD-10-CM | POA: Diagnosis not present

## 2022-06-18 DIAGNOSIS — E669 Obesity, unspecified: Secondary | ICD-10-CM | POA: Diagnosis not present

## 2022-06-18 DIAGNOSIS — E785 Hyperlipidemia, unspecified: Secondary | ICD-10-CM | POA: Diagnosis not present

## 2022-07-09 DIAGNOSIS — E785 Hyperlipidemia, unspecified: Secondary | ICD-10-CM | POA: Diagnosis not present

## 2022-07-09 DIAGNOSIS — Z713 Dietary counseling and surveillance: Secondary | ICD-10-CM | POA: Diagnosis not present

## 2022-07-09 DIAGNOSIS — E669 Obesity, unspecified: Secondary | ICD-10-CM | POA: Diagnosis not present

## 2022-07-09 DIAGNOSIS — Z6831 Body mass index (BMI) 31.0-31.9, adult: Secondary | ICD-10-CM | POA: Diagnosis not present

## 2022-08-22 DIAGNOSIS — D2261 Melanocytic nevi of right upper limb, including shoulder: Secondary | ICD-10-CM | POA: Diagnosis not present

## 2022-08-22 DIAGNOSIS — D2262 Melanocytic nevi of left upper limb, including shoulder: Secondary | ICD-10-CM | POA: Diagnosis not present

## 2022-08-22 DIAGNOSIS — D225 Melanocytic nevi of trunk: Secondary | ICD-10-CM | POA: Diagnosis not present

## 2022-08-22 DIAGNOSIS — Z85828 Personal history of other malignant neoplasm of skin: Secondary | ICD-10-CM | POA: Diagnosis not present

## 2022-12-27 DIAGNOSIS — M9903 Segmental and somatic dysfunction of lumbar region: Secondary | ICD-10-CM | POA: Diagnosis not present

## 2022-12-27 DIAGNOSIS — M9902 Segmental and somatic dysfunction of thoracic region: Secondary | ICD-10-CM | POA: Diagnosis not present

## 2022-12-27 DIAGNOSIS — M9905 Segmental and somatic dysfunction of pelvic region: Secondary | ICD-10-CM | POA: Diagnosis not present

## 2022-12-27 DIAGNOSIS — M9901 Segmental and somatic dysfunction of cervical region: Secondary | ICD-10-CM | POA: Diagnosis not present

## 2022-12-28 DIAGNOSIS — M9902 Segmental and somatic dysfunction of thoracic region: Secondary | ICD-10-CM | POA: Diagnosis not present

## 2022-12-28 DIAGNOSIS — M9905 Segmental and somatic dysfunction of pelvic region: Secondary | ICD-10-CM | POA: Diagnosis not present

## 2022-12-28 DIAGNOSIS — M9901 Segmental and somatic dysfunction of cervical region: Secondary | ICD-10-CM | POA: Diagnosis not present

## 2022-12-28 DIAGNOSIS — M9903 Segmental and somatic dysfunction of lumbar region: Secondary | ICD-10-CM | POA: Diagnosis not present

## 2023-01-01 DIAGNOSIS — M9905 Segmental and somatic dysfunction of pelvic region: Secondary | ICD-10-CM | POA: Diagnosis not present

## 2023-01-01 DIAGNOSIS — M9903 Segmental and somatic dysfunction of lumbar region: Secondary | ICD-10-CM | POA: Diagnosis not present

## 2023-01-01 DIAGNOSIS — M9901 Segmental and somatic dysfunction of cervical region: Secondary | ICD-10-CM | POA: Diagnosis not present

## 2023-01-01 DIAGNOSIS — L821 Other seborrheic keratosis: Secondary | ICD-10-CM | POA: Diagnosis not present

## 2023-01-01 DIAGNOSIS — M9902 Segmental and somatic dysfunction of thoracic region: Secondary | ICD-10-CM | POA: Diagnosis not present

## 2023-01-02 DIAGNOSIS — M9902 Segmental and somatic dysfunction of thoracic region: Secondary | ICD-10-CM | POA: Diagnosis not present

## 2023-01-02 DIAGNOSIS — M9905 Segmental and somatic dysfunction of pelvic region: Secondary | ICD-10-CM | POA: Diagnosis not present

## 2023-01-02 DIAGNOSIS — M9901 Segmental and somatic dysfunction of cervical region: Secondary | ICD-10-CM | POA: Diagnosis not present

## 2023-01-02 DIAGNOSIS — M9903 Segmental and somatic dysfunction of lumbar region: Secondary | ICD-10-CM | POA: Diagnosis not present

## 2023-01-08 DIAGNOSIS — M9902 Segmental and somatic dysfunction of thoracic region: Secondary | ICD-10-CM | POA: Diagnosis not present

## 2023-01-08 DIAGNOSIS — M9903 Segmental and somatic dysfunction of lumbar region: Secondary | ICD-10-CM | POA: Diagnosis not present

## 2023-01-08 DIAGNOSIS — M9905 Segmental and somatic dysfunction of pelvic region: Secondary | ICD-10-CM | POA: Diagnosis not present

## 2023-01-08 DIAGNOSIS — M9901 Segmental and somatic dysfunction of cervical region: Secondary | ICD-10-CM | POA: Diagnosis not present

## 2023-01-09 DIAGNOSIS — M9901 Segmental and somatic dysfunction of cervical region: Secondary | ICD-10-CM | POA: Diagnosis not present

## 2023-01-09 DIAGNOSIS — M9903 Segmental and somatic dysfunction of lumbar region: Secondary | ICD-10-CM | POA: Diagnosis not present

## 2023-01-09 DIAGNOSIS — M9902 Segmental and somatic dysfunction of thoracic region: Secondary | ICD-10-CM | POA: Diagnosis not present

## 2023-01-09 DIAGNOSIS — M9905 Segmental and somatic dysfunction of pelvic region: Secondary | ICD-10-CM | POA: Diagnosis not present

## 2023-01-14 DIAGNOSIS — Z01419 Encounter for gynecological examination (general) (routine) without abnormal findings: Secondary | ICD-10-CM | POA: Diagnosis not present

## 2023-01-14 DIAGNOSIS — Z1272 Encounter for screening for malignant neoplasm of vagina: Secondary | ICD-10-CM | POA: Diagnosis not present

## 2023-01-14 DIAGNOSIS — Z1231 Encounter for screening mammogram for malignant neoplasm of breast: Secondary | ICD-10-CM | POA: Diagnosis not present

## 2023-01-14 DIAGNOSIS — Z1151 Encounter for screening for human papillomavirus (HPV): Secondary | ICD-10-CM | POA: Diagnosis not present

## 2023-01-14 DIAGNOSIS — Z6831 Body mass index (BMI) 31.0-31.9, adult: Secondary | ICD-10-CM | POA: Diagnosis not present

## 2023-01-14 LAB — HM MAMMOGRAPHY

## 2023-01-15 ENCOUNTER — Other Ambulatory Visit: Payer: Self-pay | Admitting: Obstetrics and Gynecology

## 2023-01-15 DIAGNOSIS — M9902 Segmental and somatic dysfunction of thoracic region: Secondary | ICD-10-CM | POA: Diagnosis not present

## 2023-01-15 DIAGNOSIS — M9903 Segmental and somatic dysfunction of lumbar region: Secondary | ICD-10-CM | POA: Diagnosis not present

## 2023-01-15 DIAGNOSIS — M9901 Segmental and somatic dysfunction of cervical region: Secondary | ICD-10-CM | POA: Diagnosis not present

## 2023-01-15 DIAGNOSIS — M9905 Segmental and somatic dysfunction of pelvic region: Secondary | ICD-10-CM | POA: Diagnosis not present

## 2023-01-15 DIAGNOSIS — Z8249 Family history of ischemic heart disease and other diseases of the circulatory system: Secondary | ICD-10-CM

## 2023-01-16 DIAGNOSIS — M9902 Segmental and somatic dysfunction of thoracic region: Secondary | ICD-10-CM | POA: Diagnosis not present

## 2023-01-16 DIAGNOSIS — M9905 Segmental and somatic dysfunction of pelvic region: Secondary | ICD-10-CM | POA: Diagnosis not present

## 2023-01-16 DIAGNOSIS — M9901 Segmental and somatic dysfunction of cervical region: Secondary | ICD-10-CM | POA: Diagnosis not present

## 2023-01-16 DIAGNOSIS — M9903 Segmental and somatic dysfunction of lumbar region: Secondary | ICD-10-CM | POA: Diagnosis not present

## 2023-01-16 LAB — HM PAP SMEAR: HPV, high-risk: NEGATIVE

## 2023-01-22 DIAGNOSIS — M9902 Segmental and somatic dysfunction of thoracic region: Secondary | ICD-10-CM | POA: Diagnosis not present

## 2023-01-22 DIAGNOSIS — M9903 Segmental and somatic dysfunction of lumbar region: Secondary | ICD-10-CM | POA: Diagnosis not present

## 2023-01-22 DIAGNOSIS — M9905 Segmental and somatic dysfunction of pelvic region: Secondary | ICD-10-CM | POA: Diagnosis not present

## 2023-01-22 DIAGNOSIS — M9901 Segmental and somatic dysfunction of cervical region: Secondary | ICD-10-CM | POA: Diagnosis not present

## 2023-01-23 DIAGNOSIS — M9902 Segmental and somatic dysfunction of thoracic region: Secondary | ICD-10-CM | POA: Diagnosis not present

## 2023-01-23 DIAGNOSIS — M9903 Segmental and somatic dysfunction of lumbar region: Secondary | ICD-10-CM | POA: Diagnosis not present

## 2023-01-23 DIAGNOSIS — M9901 Segmental and somatic dysfunction of cervical region: Secondary | ICD-10-CM | POA: Diagnosis not present

## 2023-01-23 DIAGNOSIS — M9905 Segmental and somatic dysfunction of pelvic region: Secondary | ICD-10-CM | POA: Diagnosis not present

## 2023-01-29 DIAGNOSIS — M9903 Segmental and somatic dysfunction of lumbar region: Secondary | ICD-10-CM | POA: Diagnosis not present

## 2023-01-29 DIAGNOSIS — M9905 Segmental and somatic dysfunction of pelvic region: Secondary | ICD-10-CM | POA: Diagnosis not present

## 2023-01-29 DIAGNOSIS — M9902 Segmental and somatic dysfunction of thoracic region: Secondary | ICD-10-CM | POA: Diagnosis not present

## 2023-01-29 DIAGNOSIS — M9901 Segmental and somatic dysfunction of cervical region: Secondary | ICD-10-CM | POA: Diagnosis not present

## 2023-02-12 DIAGNOSIS — M9905 Segmental and somatic dysfunction of pelvic region: Secondary | ICD-10-CM | POA: Diagnosis not present

## 2023-02-12 DIAGNOSIS — M9901 Segmental and somatic dysfunction of cervical region: Secondary | ICD-10-CM | POA: Diagnosis not present

## 2023-02-12 DIAGNOSIS — M9903 Segmental and somatic dysfunction of lumbar region: Secondary | ICD-10-CM | POA: Diagnosis not present

## 2023-02-12 DIAGNOSIS — M9902 Segmental and somatic dysfunction of thoracic region: Secondary | ICD-10-CM | POA: Diagnosis not present

## 2023-02-19 DIAGNOSIS — M9903 Segmental and somatic dysfunction of lumbar region: Secondary | ICD-10-CM | POA: Diagnosis not present

## 2023-02-19 DIAGNOSIS — M9905 Segmental and somatic dysfunction of pelvic region: Secondary | ICD-10-CM | POA: Diagnosis not present

## 2023-02-19 DIAGNOSIS — M9902 Segmental and somatic dysfunction of thoracic region: Secondary | ICD-10-CM | POA: Diagnosis not present

## 2023-02-19 DIAGNOSIS — M9901 Segmental and somatic dysfunction of cervical region: Secondary | ICD-10-CM | POA: Diagnosis not present

## 2023-02-26 DIAGNOSIS — M9905 Segmental and somatic dysfunction of pelvic region: Secondary | ICD-10-CM | POA: Diagnosis not present

## 2023-02-26 DIAGNOSIS — M9902 Segmental and somatic dysfunction of thoracic region: Secondary | ICD-10-CM | POA: Diagnosis not present

## 2023-02-26 DIAGNOSIS — M9901 Segmental and somatic dysfunction of cervical region: Secondary | ICD-10-CM | POA: Diagnosis not present

## 2023-02-26 DIAGNOSIS — M9903 Segmental and somatic dysfunction of lumbar region: Secondary | ICD-10-CM | POA: Diagnosis not present

## 2023-03-12 DIAGNOSIS — M9901 Segmental and somatic dysfunction of cervical region: Secondary | ICD-10-CM | POA: Diagnosis not present

## 2023-03-12 DIAGNOSIS — M9905 Segmental and somatic dysfunction of pelvic region: Secondary | ICD-10-CM | POA: Diagnosis not present

## 2023-03-12 DIAGNOSIS — M9903 Segmental and somatic dysfunction of lumbar region: Secondary | ICD-10-CM | POA: Diagnosis not present

## 2023-03-12 DIAGNOSIS — M9902 Segmental and somatic dysfunction of thoracic region: Secondary | ICD-10-CM | POA: Diagnosis not present

## 2023-03-25 DIAGNOSIS — M9902 Segmental and somatic dysfunction of thoracic region: Secondary | ICD-10-CM | POA: Diagnosis not present

## 2023-03-25 DIAGNOSIS — M9903 Segmental and somatic dysfunction of lumbar region: Secondary | ICD-10-CM | POA: Diagnosis not present

## 2023-03-25 DIAGNOSIS — H00014 Hordeolum externum left upper eyelid: Secondary | ICD-10-CM | POA: Diagnosis not present

## 2023-03-25 DIAGNOSIS — M9905 Segmental and somatic dysfunction of pelvic region: Secondary | ICD-10-CM | POA: Diagnosis not present

## 2023-03-25 DIAGNOSIS — M9901 Segmental and somatic dysfunction of cervical region: Secondary | ICD-10-CM | POA: Diagnosis not present

## 2023-04-11 DIAGNOSIS — M9901 Segmental and somatic dysfunction of cervical region: Secondary | ICD-10-CM | POA: Diagnosis not present

## 2023-04-11 DIAGNOSIS — M9903 Segmental and somatic dysfunction of lumbar region: Secondary | ICD-10-CM | POA: Diagnosis not present

## 2023-04-11 DIAGNOSIS — M9902 Segmental and somatic dysfunction of thoracic region: Secondary | ICD-10-CM | POA: Diagnosis not present

## 2023-04-11 DIAGNOSIS — M9905 Segmental and somatic dysfunction of pelvic region: Secondary | ICD-10-CM | POA: Diagnosis not present

## 2023-04-23 DIAGNOSIS — M9903 Segmental and somatic dysfunction of lumbar region: Secondary | ICD-10-CM | POA: Diagnosis not present

## 2023-04-23 DIAGNOSIS — M9905 Segmental and somatic dysfunction of pelvic region: Secondary | ICD-10-CM | POA: Diagnosis not present

## 2023-04-23 DIAGNOSIS — M9902 Segmental and somatic dysfunction of thoracic region: Secondary | ICD-10-CM | POA: Diagnosis not present

## 2023-04-23 DIAGNOSIS — M9901 Segmental and somatic dysfunction of cervical region: Secondary | ICD-10-CM | POA: Diagnosis not present

## 2023-05-07 DIAGNOSIS — M9902 Segmental and somatic dysfunction of thoracic region: Secondary | ICD-10-CM | POA: Diagnosis not present

## 2023-05-07 DIAGNOSIS — M9903 Segmental and somatic dysfunction of lumbar region: Secondary | ICD-10-CM | POA: Diagnosis not present

## 2023-05-07 DIAGNOSIS — M9901 Segmental and somatic dysfunction of cervical region: Secondary | ICD-10-CM | POA: Diagnosis not present

## 2023-05-07 DIAGNOSIS — M9905 Segmental and somatic dysfunction of pelvic region: Secondary | ICD-10-CM | POA: Diagnosis not present

## 2023-05-21 DIAGNOSIS — M9903 Segmental and somatic dysfunction of lumbar region: Secondary | ICD-10-CM | POA: Diagnosis not present

## 2023-05-21 DIAGNOSIS — M9905 Segmental and somatic dysfunction of pelvic region: Secondary | ICD-10-CM | POA: Diagnosis not present

## 2023-05-21 DIAGNOSIS — M9901 Segmental and somatic dysfunction of cervical region: Secondary | ICD-10-CM | POA: Diagnosis not present

## 2023-05-21 DIAGNOSIS — M9902 Segmental and somatic dysfunction of thoracic region: Secondary | ICD-10-CM | POA: Diagnosis not present

## 2023-06-03 DIAGNOSIS — M9903 Segmental and somatic dysfunction of lumbar region: Secondary | ICD-10-CM | POA: Diagnosis not present

## 2023-06-03 DIAGNOSIS — M9905 Segmental and somatic dysfunction of pelvic region: Secondary | ICD-10-CM | POA: Diagnosis not present

## 2023-06-03 DIAGNOSIS — M9901 Segmental and somatic dysfunction of cervical region: Secondary | ICD-10-CM | POA: Diagnosis not present

## 2023-06-03 DIAGNOSIS — M9902 Segmental and somatic dysfunction of thoracic region: Secondary | ICD-10-CM | POA: Diagnosis not present

## 2023-06-12 LAB — VITAMIN D 25 HYDROXY (VIT D DEFICIENCY, FRACTURES): Vit D, 25-Hydroxy: 35.7

## 2023-06-12 LAB — HEPATIC FUNCTION PANEL
ALT: 20 U/L (ref 7–35)
AST: 19 (ref 13–35)
Alkaline Phosphatase: 102 (ref 25–125)
Bilirubin, Total: 0.5

## 2023-06-12 LAB — CBC AND DIFFERENTIAL
HCT: 42 (ref 36–46)
Hemoglobin: 13.5 (ref 12.0–16.0)
Platelets: 278 10*3/uL (ref 150–400)
WBC: 4.7

## 2023-06-12 LAB — BASIC METABOLIC PANEL
BUN: 17 (ref 4–21)
CO2: 21 (ref 13–22)
Chloride: 103 (ref 99–108)
Creatinine: 0.7 (ref 0.5–1.1)
Glucose: 96
Potassium: 5.1 meq/L (ref 3.5–5.1)
Sodium: 142 (ref 137–147)

## 2023-06-12 LAB — LIPID PANEL
Cholesterol: 281 — AB (ref 0–200)
HDL: 89 — AB (ref 35–70)
LDL Cholesterol: 174
Triglycerides: 107 (ref 40–160)

## 2023-06-12 LAB — COMPREHENSIVE METABOLIC PANEL
Albumin: 4.7 (ref 3.5–5.0)
Calcium: 10.3 (ref 8.7–10.7)
EGFR: 103
Globulin: 2.7

## 2023-06-12 LAB — TSH: TSH: 1.38 (ref 0.41–5.90)

## 2023-06-12 LAB — HEMOGLOBIN A1C: Hemoglobin A1C: 5.9

## 2023-06-12 LAB — CBC: RBC: 4.62 (ref 3.87–5.11)

## 2023-06-18 DIAGNOSIS — M9905 Segmental and somatic dysfunction of pelvic region: Secondary | ICD-10-CM | POA: Diagnosis not present

## 2023-06-18 DIAGNOSIS — M9901 Segmental and somatic dysfunction of cervical region: Secondary | ICD-10-CM | POA: Diagnosis not present

## 2023-06-18 DIAGNOSIS — M9902 Segmental and somatic dysfunction of thoracic region: Secondary | ICD-10-CM | POA: Diagnosis not present

## 2023-06-18 DIAGNOSIS — M9903 Segmental and somatic dysfunction of lumbar region: Secondary | ICD-10-CM | POA: Diagnosis not present

## 2023-07-02 DIAGNOSIS — M9905 Segmental and somatic dysfunction of pelvic region: Secondary | ICD-10-CM | POA: Diagnosis not present

## 2023-07-02 DIAGNOSIS — M9902 Segmental and somatic dysfunction of thoracic region: Secondary | ICD-10-CM | POA: Diagnosis not present

## 2023-07-02 DIAGNOSIS — M9901 Segmental and somatic dysfunction of cervical region: Secondary | ICD-10-CM | POA: Diagnosis not present

## 2023-07-02 DIAGNOSIS — M9903 Segmental and somatic dysfunction of lumbar region: Secondary | ICD-10-CM | POA: Diagnosis not present

## 2023-07-16 DIAGNOSIS — M9905 Segmental and somatic dysfunction of pelvic region: Secondary | ICD-10-CM | POA: Diagnosis not present

## 2023-07-16 DIAGNOSIS — M9903 Segmental and somatic dysfunction of lumbar region: Secondary | ICD-10-CM | POA: Diagnosis not present

## 2023-07-16 DIAGNOSIS — M9901 Segmental and somatic dysfunction of cervical region: Secondary | ICD-10-CM | POA: Diagnosis not present

## 2023-07-16 DIAGNOSIS — M9902 Segmental and somatic dysfunction of thoracic region: Secondary | ICD-10-CM | POA: Diagnosis not present

## 2023-07-17 DIAGNOSIS — D120 Benign neoplasm of cecum: Secondary | ICD-10-CM | POA: Diagnosis not present

## 2023-07-17 DIAGNOSIS — K648 Other hemorrhoids: Secondary | ICD-10-CM | POA: Diagnosis not present

## 2023-07-17 DIAGNOSIS — Z860101 Personal history of adenomatous and serrated colon polyps: Secondary | ICD-10-CM | POA: Diagnosis not present

## 2023-07-17 DIAGNOSIS — Z09 Encounter for follow-up examination after completed treatment for conditions other than malignant neoplasm: Secondary | ICD-10-CM | POA: Diagnosis not present

## 2023-08-13 DIAGNOSIS — M9901 Segmental and somatic dysfunction of cervical region: Secondary | ICD-10-CM | POA: Diagnosis not present

## 2023-08-13 DIAGNOSIS — M9903 Segmental and somatic dysfunction of lumbar region: Secondary | ICD-10-CM | POA: Diagnosis not present

## 2023-08-13 DIAGNOSIS — M9902 Segmental and somatic dysfunction of thoracic region: Secondary | ICD-10-CM | POA: Diagnosis not present

## 2023-08-13 DIAGNOSIS — M9905 Segmental and somatic dysfunction of pelvic region: Secondary | ICD-10-CM | POA: Diagnosis not present

## 2023-09-02 DIAGNOSIS — D2262 Melanocytic nevi of left upper limb, including shoulder: Secondary | ICD-10-CM | POA: Diagnosis not present

## 2023-09-02 DIAGNOSIS — Z85828 Personal history of other malignant neoplasm of skin: Secondary | ICD-10-CM | POA: Diagnosis not present

## 2023-09-02 DIAGNOSIS — D225 Melanocytic nevi of trunk: Secondary | ICD-10-CM | POA: Diagnosis not present

## 2023-09-02 DIAGNOSIS — D2261 Melanocytic nevi of right upper limb, including shoulder: Secondary | ICD-10-CM | POA: Diagnosis not present

## 2023-09-03 DIAGNOSIS — M9901 Segmental and somatic dysfunction of cervical region: Secondary | ICD-10-CM | POA: Diagnosis not present

## 2023-09-03 DIAGNOSIS — M9903 Segmental and somatic dysfunction of lumbar region: Secondary | ICD-10-CM | POA: Diagnosis not present

## 2023-09-03 DIAGNOSIS — M9905 Segmental and somatic dysfunction of pelvic region: Secondary | ICD-10-CM | POA: Diagnosis not present

## 2023-09-03 DIAGNOSIS — M9902 Segmental and somatic dysfunction of thoracic region: Secondary | ICD-10-CM | POA: Diagnosis not present

## 2023-09-04 ENCOUNTER — Ambulatory Visit (INDEPENDENT_AMBULATORY_CARE_PROVIDER_SITE_OTHER): Payer: BC Managed Care – PPO | Admitting: Primary Care

## 2023-09-04 ENCOUNTER — Encounter: Payer: Self-pay | Admitting: Primary Care

## 2023-09-04 VITALS — BP 114/70 | HR 67 | Temp 97.3°F | Ht 66.5 in | Wt 196.0 lb

## 2023-09-04 DIAGNOSIS — Z0000 Encounter for general adult medical examination without abnormal findings: Secondary | ICD-10-CM | POA: Diagnosis not present

## 2023-09-04 DIAGNOSIS — R7303 Prediabetes: Secondary | ICD-10-CM | POA: Diagnosis not present

## 2023-09-04 DIAGNOSIS — F411 Generalized anxiety disorder: Secondary | ICD-10-CM | POA: Diagnosis not present

## 2023-09-04 DIAGNOSIS — E785 Hyperlipidemia, unspecified: Secondary | ICD-10-CM | POA: Diagnosis not present

## 2023-09-04 NOTE — Assessment & Plan Note (Signed)
 Significant increase in LDL compared to 2023. Patient brings labs with her today, LDL of 174 from October 2024.  She is due for repeat labs in a few weeks.  She will send us  results.  Given family history of CAD in father, coupled with LDL results, will obtain CT coronary artery scan.  She agrees. Orders placed.

## 2023-09-04 NOTE — Assessment & Plan Note (Signed)
 Discussed Shingrix vaccine.  She will obtain at her local pharmacy. Pap smear UTD. Follows with GYN Mammogram UTD.  Completes at GYN office. Colonoscopy UTD, due 2029  Discussed the importance of a healthy diet and regular exercise in order for weight loss, and to reduce the risk of further co-morbidity.  Exam stable. Labs reviewed.  Follow up in 1 year for repeat physical.

## 2023-09-04 NOTE — Assessment & Plan Note (Signed)
 Waxes and wanes. Overall manages well on her own.  Continue to monitor.

## 2023-09-04 NOTE — Progress Notes (Signed)
 Subjective:    Patient ID: Karina Barnett, female    DOB: [birth year], 58 y.o.   MRN: [id number]  HPI  Karina Barnett is a very pleasant 58 y.o. female who presents today for complete physical and follow up of chronic conditions.  She would also like to discuss epigastric pain. Sudden onset of epigastric pain and abdominal bloating while shopping at [facility]. She proceeded to her car, called paramedics who completed a ECG which did not show MI. She admits to a poor diet and increased stress over the last year. She's taken famotidine with resolve. She's changed her diet, has not had any symptoms since. Previously managed on pantoprazole  in the past for chest pain which was determined to be gastric in cause. She has a family history of CABG in her father. She has not completed a coronary CT scan.   Immunizations: -Tetanus: Completed in 2023 -Influenza: Completed this season  -Shingles: Never completed.   Diet: Fair diet.  Exercise: No regular exercise.  Eye exam: Completes annually  Dental exam: Completes semi-annually    Pap Smear: Completed in 2023 per GYN Mammogram: Completes at GYN office and is UTD.  Colonoscopy: Completed in December 2024 per [facility]. Due 2029.   BP Readings from Last 3 Encounters:  09/04/23 114/70  06/13/22 116/72  08/15/18 (!) 102/49      Review of Systems  Constitutional:  Negative for unexpected weight change.  HENT:  Negative for rhinorrhea.   Respiratory:  Negative for cough and shortness of breath.   Cardiovascular:  Negative for chest pain.  Gastrointestinal:  Negative for constipation and diarrhea.  Genitourinary:  Negative for difficulty urinating.  Musculoskeletal:  Negative for arthralgias and myalgias.  Skin:  Negative for rash.  Allergic/Immunologic: Negative for environmental allergies.  Neurological:  Negative for dizziness, numbness and headaches.  Psychiatric/Behavioral:  The patient is not nervous/anxious.           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  The Hereditary Gene Panel offered by [facility] includes sequencing and/or deletion duplication testing of the following 43 genes: APC, ATM, AXIN2, BARD1, BMPR1A, BRCA1, BRCA2, BRIP1, CDH1, CDKN2A (p14ARF), CDKN2A (p16INK4a), CHEK2, DICER1, EPCAM (Deletion/duplication testing only), GREM1 (promoter region deletion/duplication testing on   H/O Bell's palsy 11/06/2011   In setting of FUO   Left knee pain 05/26/2014   Overweight (BMI 25.0-29.9)    S/P laparoscopic assisted vaginal hysterectomy (LAVH) 08/14/2018   Vocal cord polyp     Social History   Socioeconomic History   Marital status: Married    Spouse name: Not on file   Number of children: Not on file   Years of education: Not on file   Highest education level: Doctorate  Occupational History   Not on file  Tobacco Use   Smoking status: Never   Smokeless tobacco: Never  Vaping Use   Vaping status: Never Used  Substance and Sexual Activity   Alcohol use: Yes    Comment: social, occasional   Drug use: No  Sexual activity: Yes    Birth control/protection: I.U.D.  Other Topics Concern   Not on file  Social History Narrative   Not on file   Social Drivers of Health   Financial Resource Strain: Low Risk  (09/02/2023)   Overall Financial Resource Strain (CARDIA)    Difficulty of Paying Living Expenses: Not very hard  Food Insecurity: No Food Insecurity (09/02/2023)   Hunger Vital Sign    Worried About Running Out of Food in the Last Year: Never true    Ran Out of Food in the  Last Year: Never true  Transportation Needs: No Transportation Needs (09/02/2023)   PRAPARE - Administrator, Civil Service (Medical): No    Lack of Transportation (Non-Medical): No  Physical Activity: Unknown (09/02/2023)   Exercise Vital Sign    Days of Exercise per Week: 0 days    Minutes of Exercise per Session: Not on file  Stress: Stress Concern Present (09/02/2023)   [employer] of Occupational Health - Occupational Stress Questionnaire    Feeling of Stress : To some extent  Social Connections: Socially Integrated (09/02/2023)   Social Connection and Isolation Panel [NHANES]    Frequency of Communication with Friends and Family: More than three times a week    Frequency of Social Gatherings with Friends and Family: Twice a week    Attends Religious Services: More than 4 times per year    Active Member of [employer] or Organizations: Yes    Attends Banker Meetings: 1 to 4 times per year    Marital Status: Married  Catering Manager Violence: Not on file    Past Surgical History:  Procedure Laterality Date   ANTERIOR AND POSTERIOR REPAIR WITH SACROSPINOUS FIXATION N/A 08/14/2018   Procedure: ANTERIOR AND POSTERIOR REPAIR WITH SACROSPINOUS FIXATION;  Surgeon: Leva Rush, MD;  Location: [facility];  Service: Gynecology;  Laterality: N/A;   BLADDER SUSPENSION N/A 08/14/2018   Procedure: transobturator sling;  Surgeon: Leva Rush, MD;  Location[facility];  Service: Gynecology;  Laterality: N/A;   COLONOSCOPY     CYSTOSCOPY N/A 08/14/2018   Procedure: CYSTOSCOPY;  Surgeon: Leva Rush, MD;  Location: [facility];  Service: Gynecology;  Laterality: N/A;   LAPAROSCOPIC VAGINAL HYSTERECTOMY WITH SALPINGO OOPHORECTOMY Bilateral 08/14/2018   Procedure: LAPAROSCOPIC ASSISTED VAGINAL HYSTERECTOMY WITH SALPINGO OOPHORECTOMY;  Surgeon: Leva Rush, MD;  Location: [facility];  Service: Gynecology;   Laterality: Bilateral;  need bed    Family History  Problem Relation Age of Onset   Hypertension Mother    Irritable bowel syndrome Mother    Other Mother        hx of hysterectomy in her late 72s-early 55s for prolapsed uterus   Breast cancer Mother    CAD Father    Breast cancer Sister 68       DCIS   Other Sister        dx benign grape-sized tumor of abdomen   Arthritis Maternal Grandmother    Alzheimer's disease Maternal Grandmother        d. 80y   Hypertension Maternal Grandfather    Heart disease Maternal Grandfather    Heart Problems Maternal Grandfather        open heart surgery   Stroke Maternal Grandfather        d. 34y   Skin cancer Maternal Grandfather        hx of skin burn; unspecified type   Alzheimer's  disease Paternal Grandmother    Diabetes Paternal Grandmother        borderline   Lung cancer Paternal Grandfather        d. 75; tumor of bronchial arch; hx of chemical inhalation - worked in marine scientist   Breast cancer Cousin 40       paternal 1st cousin    Pancreatic cancer Cousin 98       paternal 1st cousin d. 62y; smoker   Other Other        icthyosis    Allergies  Allergen Reactions   Flexeril [Cyclobenzaprine Hcl] Other (See Comments)    Causes severe joint pain   Penicillins Rash    Has patient had a PCN reaction causing immediate rash, facial/tongue/throat swelling, SOB or lightheadedness with hypotension: Unknown Has patient had a PCN reaction causing severe rash involving mucus membranes or skin necrosis: No Has patient had a PCN reaction that required hospitalization: No Has patient had a PCN reaction occurring within the last 10 years: No If all of the above answers are NO, then may proceed with Cephalosporin use.     Current Outpatient Medications on File Prior to Visit  Medication Sig Dispense Refill   acyclovir  (ZOVIRAX ) 200 MG capsule Take 1 capsule (200 mg total) by mouth 2 (two) times daily as needed. 10 capsule 1   cholecalciferol  (VITAMIN D3) 25 MCG (1000 UT) tablet Take 50,000 Units by mouth once a week.     Multiple Vitamins-Minerals (MULTI FOR HER) TABS 1 tablet Orally once a day     ALPRAZolam  (XANAX ) 0.25 MG tablet Take 1 tablet (0.25 mg total) by mouth at bedtime as needed. (Patient not taking: Reported on 09/04/2023) 30 tablet 1   No current facility-administered medications on file prior to visit.    BP 114/70   Pulse 67   Temp (!) 97.3 F (36.3 C) (Temporal)   Ht 5' 6.5 (1.689 m)   Wt 196 lb (88.9 kg)   LMP 11/16/2012   SpO2 98%   BMI 31.16 kg/m  Objective:   Physical Exam HENT:     Right Ear: Tympanic membrane and ear canal normal.     Left Ear: Tympanic membrane and ear canal normal.  Eyes:     Pupils: Pupils are equal, round, and reactive to light.  Cardiovascular:     Rate and Rhythm: Normal rate and regular rhythm.  Pulmonary:     Effort: Pulmonary effort is normal.     Breath sounds: Normal breath sounds.  Abdominal:     General: Bowel sounds are normal.     Palpations: Abdomen is soft.     Tenderness: There is no abdominal tenderness.  Musculoskeletal:        General: Normal range of motion.     Cervical back: Neck supple.  Skin:    General: Skin is warm and dry.  Neurological:     Mental Status: She is alert and oriented to person, place, and time.     Cranial Nerves: No cranial nerve deficit.     Deep Tendon Reflexes:     Reflex Scores:      Patellar reflexes are 2+ on the right side and 2+ on the left side. Psychiatric:        Mood and Affect: Mood normal.           Assessment & Plan:  Preventative health care Assessment & Plan: Discussed Shingrix vaccine.  She will obtain at her local pharmacy. Pap smear UTD.  Follows with GYN Mammogram UTD.  Completes at GYN office. Colonoscopy UTD, due 2029  Discussed the importance of a healthy diet and regular exercise in order for weight loss, and to reduce the risk of further co-morbidity.  Exam stable. Labs  reviewed.  Follow up in 1 year for repeat physical.    Hyperlipidemia, unspecified hyperlipidemia type Assessment & Plan: Significant increase in LDL compared to 2023. Patient brings labs with her today, LDL of 174 from October 2024.  She is due for repeat labs in a few weeks.  She will send us  results.  Given family history of CAD in father, coupled with LDL results, will obtain CT coronary artery scan.  She agrees. Orders placed.  Orders: -     CT CARDIAC SCORING (SELF PAY ONLY); Future  GAD (generalized anxiety disorder) Assessment & Plan: Waxes and wanes. Overall manages well on her own.  Continue to monitor.   Prediabetes Assessment & Plan: Recent lab results from employer with A1c of 5.9. She is already working on changes in her lifestyle.  Repeat A1c pending in a couple of weeks per employer.  She will share results.         Lenord Fralix K Alila Sotero, NP

## 2023-09-04 NOTE — Assessment & Plan Note (Signed)
 Recent lab results from employer with A1c of 5.9. She is already working on changes in her lifestyle.  Repeat A1c pending in a couple of weeks per employer.  She will share results.

## 2023-09-10 LAB — HM COLONOSCOPY

## 2023-09-11 ENCOUNTER — Telehealth (HOSPITAL_BASED_OUTPATIENT_CLINIC_OR_DEPARTMENT_OTHER): Payer: Self-pay

## 2023-09-17 DIAGNOSIS — M9903 Segmental and somatic dysfunction of lumbar region: Secondary | ICD-10-CM | POA: Diagnosis not present

## 2023-09-17 DIAGNOSIS — M9901 Segmental and somatic dysfunction of cervical region: Secondary | ICD-10-CM | POA: Diagnosis not present

## 2023-09-17 DIAGNOSIS — M9902 Segmental and somatic dysfunction of thoracic region: Secondary | ICD-10-CM | POA: Diagnosis not present

## 2023-09-17 DIAGNOSIS — M9905 Segmental and somatic dysfunction of pelvic region: Secondary | ICD-10-CM | POA: Diagnosis not present

## 2023-10-01 DIAGNOSIS — M9903 Segmental and somatic dysfunction of lumbar region: Secondary | ICD-10-CM | POA: Diagnosis not present

## 2023-10-01 DIAGNOSIS — M9902 Segmental and somatic dysfunction of thoracic region: Secondary | ICD-10-CM | POA: Diagnosis not present

## 2023-10-01 DIAGNOSIS — M9905 Segmental and somatic dysfunction of pelvic region: Secondary | ICD-10-CM | POA: Diagnosis not present

## 2023-10-01 DIAGNOSIS — M9901 Segmental and somatic dysfunction of cervical region: Secondary | ICD-10-CM | POA: Diagnosis not present

## 2023-10-15 DIAGNOSIS — M9905 Segmental and somatic dysfunction of pelvic region: Secondary | ICD-10-CM | POA: Diagnosis not present

## 2023-10-15 DIAGNOSIS — M9901 Segmental and somatic dysfunction of cervical region: Secondary | ICD-10-CM | POA: Diagnosis not present

## 2023-10-15 DIAGNOSIS — M9902 Segmental and somatic dysfunction of thoracic region: Secondary | ICD-10-CM | POA: Diagnosis not present

## 2023-10-15 DIAGNOSIS — M9903 Segmental and somatic dysfunction of lumbar region: Secondary | ICD-10-CM | POA: Diagnosis not present

## 2023-10-28 DIAGNOSIS — K8020 Calculus of gallbladder without cholecystitis without obstruction: Secondary | ICD-10-CM | POA: Diagnosis not present

## 2023-10-28 DIAGNOSIS — R7401 Elevation of levels of liver transaminase levels: Secondary | ICD-10-CM | POA: Diagnosis not present

## 2023-11-01 ENCOUNTER — Ambulatory Visit (HOSPITAL_BASED_OUTPATIENT_CLINIC_OR_DEPARTMENT_OTHER)
Admission: RE | Admit: 2023-11-01 | Discharge: 2023-11-01 | Disposition: A | Discharge status: Home / Self Care | Payer: Self-pay | Source: Ambulatory Visit | Attending: Primary Care | Admitting: Primary Care

## 2023-11-01 DIAGNOSIS — E785 Hyperlipidemia, unspecified: Secondary | ICD-10-CM | POA: Insufficient documentation

## 2023-11-03 DIAGNOSIS — K8020 Calculus of gallbladder without cholecystitis without obstruction: Secondary | ICD-10-CM

## 2023-11-05 DIAGNOSIS — M9903 Segmental and somatic dysfunction of lumbar region: Secondary | ICD-10-CM | POA: Diagnosis not present

## 2023-11-05 DIAGNOSIS — M9902 Segmental and somatic dysfunction of thoracic region: Secondary | ICD-10-CM | POA: Diagnosis not present

## 2023-11-05 DIAGNOSIS — M9905 Segmental and somatic dysfunction of pelvic region: Secondary | ICD-10-CM | POA: Diagnosis not present

## 2023-11-05 DIAGNOSIS — M9901 Segmental and somatic dysfunction of cervical region: Secondary | ICD-10-CM | POA: Diagnosis not present

## 2023-11-12 DIAGNOSIS — M9903 Segmental and somatic dysfunction of lumbar region: Secondary | ICD-10-CM | POA: Diagnosis not present

## 2023-11-12 DIAGNOSIS — M9902 Segmental and somatic dysfunction of thoracic region: Secondary | ICD-10-CM | POA: Diagnosis not present

## 2023-11-12 DIAGNOSIS — M9905 Segmental and somatic dysfunction of pelvic region: Secondary | ICD-10-CM | POA: Diagnosis not present

## 2023-11-12 DIAGNOSIS — M9901 Segmental and somatic dysfunction of cervical region: Secondary | ICD-10-CM | POA: Diagnosis not present

## 2023-11-27 DIAGNOSIS — M9901 Segmental and somatic dysfunction of cervical region: Secondary | ICD-10-CM | POA: Diagnosis not present

## 2023-11-27 DIAGNOSIS — M9905 Segmental and somatic dysfunction of pelvic region: Secondary | ICD-10-CM | POA: Diagnosis not present

## 2023-11-27 DIAGNOSIS — M9903 Segmental and somatic dysfunction of lumbar region: Secondary | ICD-10-CM | POA: Diagnosis not present

## 2023-11-27 DIAGNOSIS — M9902 Segmental and somatic dysfunction of thoracic region: Secondary | ICD-10-CM | POA: Diagnosis not present

## 2023-12-10 DIAGNOSIS — M9903 Segmental and somatic dysfunction of lumbar region: Secondary | ICD-10-CM | POA: Diagnosis not present

## 2023-12-10 DIAGNOSIS — M9905 Segmental and somatic dysfunction of pelvic region: Secondary | ICD-10-CM | POA: Diagnosis not present

## 2023-12-10 DIAGNOSIS — M9902 Segmental and somatic dysfunction of thoracic region: Secondary | ICD-10-CM | POA: Diagnosis not present

## 2023-12-10 DIAGNOSIS — M9901 Segmental and somatic dysfunction of cervical region: Secondary | ICD-10-CM | POA: Diagnosis not present

## 2023-12-24 DIAGNOSIS — M9901 Segmental and somatic dysfunction of cervical region: Secondary | ICD-10-CM | POA: Diagnosis not present

## 2023-12-24 DIAGNOSIS — M9905 Segmental and somatic dysfunction of pelvic region: Secondary | ICD-10-CM | POA: Diagnosis not present

## 2023-12-24 DIAGNOSIS — M9902 Segmental and somatic dysfunction of thoracic region: Secondary | ICD-10-CM | POA: Diagnosis not present

## 2023-12-24 DIAGNOSIS — M9903 Segmental and somatic dysfunction of lumbar region: Secondary | ICD-10-CM | POA: Diagnosis not present

## 2023-12-31 DIAGNOSIS — K8020 Calculus of gallbladder without cholecystitis without obstruction: Secondary | ICD-10-CM | POA: Diagnosis not present

## 2024-01-06 NOTE — Progress Notes (Signed)
 Surgical Instructions   Your procedure is scheduled on Tuesday, May 20th.  Report to [facility] Main Entrance "A" at 7:30 A.M., then check in with the Admitting office. Any questions or running late day of surgery: call [phone]  Questions prior to your surgery date: call [phone], Monday-Friday, 8am-4pm. If you experience any cold or flu symptoms such as cough, fever, chills, shortness of breath, etc. between now and your scheduled surgery, please notify us  at the above number.     Remember:  Do not eat after midnight the night before your surgery   You may drink clear liquids until 6:30 the morning of your surgery.   Clear liquids allowed are: Water, Non-Citrus Juices (without pulp), Carbonated Beverages, Clear Tea (no milk, honey, etc.), Black Coffee Only (NO MILK, CREAM OR POWDERED CREAMER of any kind), and Gatorade.    Take these medicines the morning of surgery with A SIP OF WATER: NONE  One week prior to surgery, STOP taking any Aspirin (unless otherwise instructed by your surgeon) Aleve, Naproxen, Ibuprofen, Motrin, Advil, Goody's, BC's, all herbal medications, fish oil, and non-prescription vitamins.                     Do NOT Smoke (Tobacco/Vaping) for 24 hours prior to your procedure.  If you use a CPAP at night, you may bring your mask/headgear for your overnight stay.   You will be asked to remove any contacts, glasses, piercing's, hearing aid's, dentures/partials prior to surgery. Please bring cases for these items if needed.    Patients discharged the day of surgery will not be allowed to drive home, and someone needs to stay with them for 24 hours.  SURGICAL WAITING ROOM VISITATION Patients may have no more than 2 support people in the waiting area - these visitors may rotate.   Pre-op nurse will coordinate an appropriate time for 1 ADULT support person, who may not rotate, to accompany patient in pre-op.  Children under the age of 80 must have an adult with  them who is not the patient and must remain in the main waiting area with an adult.  If the patient needs to stay at the hospital during part of their recovery, the visitor guidelines for inpatient rooms apply.  Please refer to the [facility] website for the visitor guidelines for any additional information.   If you received a COVID test during your pre-op visit  it is requested that you wear a mask when out in public, stay away from anyone that may not be feeling well and notify your surgeon if you develop symptoms. If you have been in contact with anyone that has tested positive in the last 10 days please notify you surgeon.      Pre-operative CHG Bathing Instructions   You can play a key role in reducing the risk of infection after surgery. Your skin needs to be as free of germs as possible. You can reduce the number of germs on your skin by washing with CHG (chlorhexidine  gluconate) soap before surgery. CHG is an antiseptic soap that kills germs and continues to kill germs even after washing.   DO NOT use if you have an allergy to chlorhexidine /CHG or antibacterial soaps. If your skin becomes reddened or irritated, stop using the CHG and notify one of our RNs at [phone].              TAKE A SHOWER THE NIGHT BEFORE SURGERY AND THE DAY OF SURGERY  Please keep in mind the following:  DO NOT shave, including legs and underarms, 48 hours prior to surgery.   You may shave your face before/day of surgery.  Place clean sheets on your bed the night before surgery Use a clean washcloth (not used since being washed) for each shower. DO NOT sleep with pet's night before surgery.  CHG Shower Instructions:  Wash your face and private area with normal soap. If you choose to wash your hair, wash first with your normal shampoo.  After you use shampoo/soap, rinse your hair and body thoroughly to remove shampoo/soap residue.  Turn the water OFF and apply half the bottle of CHG soap to a CLEAN  washcloth.  Apply CHG soap ONLY FROM YOUR NECK DOWN TO YOUR TOES (washing for 3-5 minutes)  DO NOT use CHG soap on face, private areas, open wounds, or sores.  Pay special attention to the area where your surgery is being performed.  If you are having back surgery, having someone wash your back for you may be helpful. Wait 2 minutes after CHG soap is applied, then you may rinse off the CHG soap.  Pat dry with a clean towel  Put on clean pajamas    Additional instructions for the day of surgery: DO NOT APPLY any lotions, deodorants, cologne, or perfumes.   Do not wear jewelry or makeup Do not wear nail polish, gel polish, artificial nails, or any other type of covering on natural nails (fingers and toes) Do not bring valuables to the hospital. [facility] is not responsible for valuables/personal belongings. Put on clean/comfortable clothes.  Please brush your teeth.  Ask your nurse before applying any prescription medications to the skin.

## 2024-01-07 ENCOUNTER — Other Ambulatory Visit: Payer: Self-pay

## 2024-01-07 ENCOUNTER — Other Ambulatory Visit: Payer: Self-pay | Admitting: General Surgery

## 2024-01-07 ENCOUNTER — Encounter (HOSPITAL_COMMUNITY): Payer: Self-pay | Admitting: *Deleted

## 2024-01-07 ENCOUNTER — Encounter (HOSPITAL_COMMUNITY)
Admission: RE | Admit: 2024-01-07 | Discharge: 2024-01-07 | Disposition: A | Discharge status: Home / Self Care | Source: Ambulatory Visit | Attending: General Surgery | Admitting: General Surgery

## 2024-01-07 VITALS — BP 117/69 | HR 66 | Temp 98.2°F | Resp 17 | Ht 66.5 in | Wt 194.7 lb

## 2024-01-07 DIAGNOSIS — M9901 Segmental and somatic dysfunction of cervical region: Secondary | ICD-10-CM | POA: Diagnosis not present

## 2024-01-07 DIAGNOSIS — Z01818 Encounter for other preprocedural examination: Secondary | ICD-10-CM

## 2024-01-07 DIAGNOSIS — M9903 Segmental and somatic dysfunction of lumbar region: Secondary | ICD-10-CM | POA: Diagnosis not present

## 2024-01-07 DIAGNOSIS — Z01812 Encounter for preprocedural laboratory examination: Secondary | ICD-10-CM | POA: Insufficient documentation

## 2024-01-07 DIAGNOSIS — M9902 Segmental and somatic dysfunction of thoracic region: Secondary | ICD-10-CM | POA: Diagnosis not present

## 2024-01-07 DIAGNOSIS — M9905 Segmental and somatic dysfunction of pelvic region: Secondary | ICD-10-CM | POA: Diagnosis not present

## 2024-01-07 LAB — CBC
HCT: 40.9 % (ref 36.0–46.0)
Hemoglobin: 13.3 g/dL (ref 12.0–15.0)
MCH: 29.2 pg (ref 26.0–34.0)
MCHC: 32.5 g/dL (ref 30.0–36.0)
MCV: 89.9 fL (ref 80.0–100.0)
Platelets: 255 10*3/uL (ref 150–400)
RBC: 4.55 MIL/uL (ref 3.87–5.11)
RDW: 12.9 % (ref 11.5–15.5)
WBC: 5.5 10*3/uL (ref 4.0–10.5)
nRBC: 0 % (ref 0.0–0.2)

## 2024-01-07 NOTE — Progress Notes (Signed)
 PCP - Brigid Canada Cardiologist - denies  PPM/ICD - denies Device Orders -  Rep Notified -   Chest x-ray - na EKG -11/18/12 Stress Test - denies ECHO - denies Cardiac Cath - denies  Sleep Study - denies CPAP -   Fasting Blood Sugar - denies Checks Blood Sugar _____ times a day  Last dose of GLP1 agonist-  na GLP1 instructions:   Blood Thinner Instructions:na Aspirin Instructions:  ERAS Protcol - clears until 0630 PRE-SURGERY Ensure or G2- no  COVID TEST- na   Anesthesia review: no  Patient denies shortness of breath, fever, cough and chest pain at PAT appointment   All instructions explained to the patient, with a verbal understanding of the material. Patient agrees to go over the instructions while at home for a better understanding . The opportunity to ask questions was provided.

## 2024-01-14 ENCOUNTER — Ambulatory Visit (HOSPITAL_COMMUNITY): Admitting: Anesthesiology

## 2024-01-14 ENCOUNTER — Other Ambulatory Visit: Payer: Self-pay

## 2024-01-14 ENCOUNTER — Encounter (HOSPITAL_COMMUNITY)
Admission: RE | Disposition: A | Discharge status: Home / Self Care | Payer: Self-pay | Source: Ambulatory Visit | Attending: General Surgery

## 2024-01-14 ENCOUNTER — Observation Stay (HOSPITAL_COMMUNITY)
Admission: RE | Admit: 2024-01-14 | Discharge: 2024-01-15 | Disposition: A | Discharge status: Home / Self Care | Source: Ambulatory Visit | Attending: General Surgery | Admitting: General Surgery

## 2024-01-14 ENCOUNTER — Encounter (HOSPITAL_COMMUNITY): Payer: Self-pay | Admitting: General Surgery

## 2024-01-14 DIAGNOSIS — K8010 Calculus of gallbladder with chronic cholecystitis without obstruction: Principal | ICD-10-CM | POA: Insufficient documentation

## 2024-01-14 DIAGNOSIS — K811 Chronic cholecystitis: Secondary | ICD-10-CM | POA: Diagnosis not present

## 2024-01-14 DIAGNOSIS — G8918 Other acute postprocedural pain: Secondary | ICD-10-CM | POA: Diagnosis not present

## 2024-01-14 DIAGNOSIS — Z9049 Acquired absence of other specified parts of digestive tract: Secondary | ICD-10-CM

## 2024-01-14 HISTORY — PX: CHOLECYSTECTOMY: SHX55

## 2024-01-14 SURGERY — LAPAROSCOPIC CHOLECYSTECTOMY
Anesthesia: General

## 2024-01-14 MED ORDER — BUPIVACAINE-EPINEPHRINE (PF) 0.25% -1:200000 IJ SOLN
INTRAMUSCULAR | Status: DC | PRN
  Administered 2024-01-14 (×2): 25 mL

## 2024-01-14 MED ORDER — PROPOFOL 10 MG/ML IV BOLUS
INTRAVENOUS | Status: AC
  Filled 2024-01-14: qty 20

## 2024-01-14 MED ORDER — METHOCARBAMOL 500 MG PO TABS
500.0000 mg | ORAL_TABLET | Freq: Three times a day (TID) | ORAL | Status: DC | PRN
  Administered 2024-01-14 – 2024-01-15 (×2): 500 mg via ORAL
  Filled 2024-01-14 (×2): qty 1

## 2024-01-14 MED ORDER — FENTANYL CITRATE (PF) 250 MCG/5ML IJ SOLN
INTRAMUSCULAR | Status: AC
  Filled 2024-01-14: qty 5

## 2024-01-14 MED ORDER — MIDAZOLAM HCL 2 MG/2ML IJ SOLN
INTRAMUSCULAR | Status: DC | PRN
  Administered 2024-01-14: 2 mg via INTRAVENOUS

## 2024-01-14 MED ORDER — FENTANYL CITRATE (PF) 100 MCG/2ML IJ SOLN
INTRAMUSCULAR | Status: AC
  Filled 2024-01-14: qty 2

## 2024-01-14 MED ORDER — SODIUM CHLORIDE 0.9 % IV SOLN: INTRAVENOUS | Status: DC

## 2024-01-14 MED ORDER — BUPIVACAINE-EPINEPHRINE 0.25% -1:200000 IJ SOLN
INTRAMUSCULAR | Status: DC | PRN
  Administered 2024-01-14: 12 mL

## 2024-01-14 MED ORDER — SUGAMMADEX SODIUM 200 MG/2ML IV SOLN
INTRAVENOUS | Status: DC | PRN
  Administered 2024-01-14: 200 mg via INTRAVENOUS

## 2024-01-14 MED ORDER — ONDANSETRON 4 MG PO TBDP: 4.0000 mg | ORAL_TABLET | Freq: Four times a day (QID) | ORAL | Status: DC | PRN

## 2024-01-14 MED ORDER — SODIUM CHLORIDE 0.9 % IR SOLN
Status: DC | PRN
  Administered 2024-01-14: 1000 mL

## 2024-01-14 MED ORDER — DEXAMETHASONE SODIUM PHOSPHATE 10 MG/ML IJ SOLN
INTRAMUSCULAR | Status: DC | PRN
  Administered 2024-01-14: 10 mg via INTRAVENOUS

## 2024-01-14 MED ORDER — 0.9 % SODIUM CHLORIDE (POUR BTL) OPTIME
TOPICAL | Status: DC | PRN
  Administered 2024-01-14: 1000 mL

## 2024-01-14 MED ORDER — ORAL CARE MOUTH RINSE: 15.0000 mL | Freq: Once | OROMUCOSAL | Status: DC

## 2024-01-14 MED ORDER — FENTANYL CITRATE (PF) 100 MCG/2ML IJ SOLN
25.0000 ug | INTRAMUSCULAR | Status: DC | PRN
  Administered 2024-01-14 (×2): 50 ug via INTRAVENOUS
  Administered 2024-01-14: 25 ug via INTRAVENOUS

## 2024-01-14 MED ORDER — ONDANSETRON HCL 4 MG/2ML IJ SOLN
4.0000 mg | Freq: Four times a day (QID) | INTRAMUSCULAR | Status: DC | PRN
  Administered 2024-01-14: 4 mg via INTRAVENOUS
  Filled 2024-01-14: qty 2

## 2024-01-14 MED ORDER — FENTANYL CITRATE (PF) 100 MCG/2ML IJ SOLN: 50.0000 ug | Freq: Once | INTRAMUSCULAR | Status: AC

## 2024-01-14 MED ORDER — MIDAZOLAM HCL 2 MG/2ML IJ SOLN: 2.0000 mg | Freq: Once | INTRAMUSCULAR | Status: AC

## 2024-01-14 MED ORDER — OXYCODONE HCL 5 MG PO TABS
5.0000 mg | ORAL_TABLET | ORAL | Status: DC | PRN
  Administered 2024-01-14 – 2024-01-15 (×3): 5 mg via ORAL
  Filled 2024-01-14 (×3): qty 1

## 2024-01-14 MED ORDER — INDOCYANINE GREEN 25 MG IV SOLR
INTRAVENOUS | Status: DC | PRN
  Administered 2024-01-14: 2.5 mg via INTRAVENOUS

## 2024-01-14 MED ORDER — ACETAMINOPHEN 650 MG RE SUPP: 650.0000 mg | Freq: Four times a day (QID) | RECTAL | Status: DC | PRN

## 2024-01-14 MED ORDER — SIMETHICONE 80 MG PO CHEW
40.0000 mg | CHEWABLE_TABLET | Freq: Four times a day (QID) | ORAL | Status: DC | PRN
  Administered 2024-01-14: 40 mg via ORAL
  Filled 2024-01-14: qty 1

## 2024-01-14 MED ORDER — CHLORHEXIDINE GLUCONATE CLOTH 2 % EX PADS: 6.0000 | MEDICATED_PAD | Freq: Once | CUTANEOUS | Status: DC

## 2024-01-14 MED ORDER — CHLORHEXIDINE GLUCONATE 0.12 % MT SOLN
15.0000 mL | Freq: Once | OROMUCOSAL | Status: AC
  Administered 2024-01-14: 15 mL via OROMUCOSAL
  Filled 2024-01-14: qty 15

## 2024-01-14 MED ORDER — FENTANYL CITRATE (PF) 100 MCG/2ML IJ SOLN
INTRAMUSCULAR | Status: AC
  Administered 2024-01-14: 50 ug via INTRAVENOUS
  Filled 2024-01-14: qty 2

## 2024-01-14 MED ORDER — AMISULPRIDE (ANTIEMETIC) 5 MG/2ML IV SOLN: 10.0000 mg | Freq: Once | INTRAVENOUS | Status: DC | PRN

## 2024-01-14 MED ORDER — LIDOCAINE 2% (20 MG/ML) 5 ML SYRINGE
INTRAMUSCULAR | Status: DC | PRN
  Administered 2024-01-14: 40 mg via INTRAVENOUS

## 2024-01-14 MED ORDER — EPHEDRINE SULFATE-NACL 50-0.9 MG/10ML-% IV SOSY
PREFILLED_SYRINGE | INTRAVENOUS | Status: DC | PRN
  Administered 2024-01-14: 5 mg via INTRAVENOUS

## 2024-01-14 MED ORDER — BUPIVACAINE-EPINEPHRINE (PF) 0.25% -1:200000 IJ SOLN
INTRAMUSCULAR | Status: AC
  Filled 2024-01-14: qty 30

## 2024-01-14 MED ORDER — ACETAMINOPHEN 325 MG PO TABS: 650.0000 mg | ORAL_TABLET | Freq: Four times a day (QID) | ORAL | Status: DC | PRN

## 2024-01-14 MED ORDER — ONDANSETRON HCL 4 MG/2ML IJ SOLN
INTRAMUSCULAR | Status: DC | PRN
  Administered 2024-01-14: 4 mg via INTRAVENOUS

## 2024-01-14 MED ORDER — ALBUMIN HUMAN 5 % IV SOLN: INTRAVENOUS | Status: DC | PRN

## 2024-01-14 MED ORDER — MIDAZOLAM HCL 2 MG/2ML IJ SOLN
INTRAMUSCULAR | Status: AC
  Filled 2024-01-14: qty 2

## 2024-01-14 MED ORDER — MIDAZOLAM HCL 2 MG/2ML IJ SOLN
INTRAMUSCULAR | Status: AC
Start: 2024-01-14 — End: 2024-01-14
  Administered 2024-01-14: 2 mg via INTRAVENOUS
  Filled 2024-01-14: qty 2

## 2024-01-14 MED ORDER — ONDANSETRON HCL 4 MG/2ML IJ SOLN
INTRAMUSCULAR | Status: AC
  Filled 2024-01-14: qty 2

## 2024-01-14 MED ORDER — ORAL CARE MOUTH RINSE: 15.0000 mL | Freq: Once | OROMUCOSAL | Status: AC

## 2024-01-14 MED ORDER — FENTANYL CITRATE (PF) 250 MCG/5ML IJ SOLN
INTRAMUSCULAR | Status: DC | PRN
  Administered 2024-01-14: 50 ug via INTRAVENOUS
  Administered 2024-01-14 (×2): 100 ug via INTRAVENOUS

## 2024-01-14 MED ORDER — HEMOSTATIC AGENTS (NO CHARGE) OPTIME
TOPICAL | Status: DC | PRN
  Administered 2024-01-14: 4

## 2024-01-14 MED ORDER — ACETAMINOPHEN 500 MG PO TABS
1000.0000 mg | ORAL_TABLET | ORAL | Status: AC
  Administered 2024-01-14: 1000 mg via ORAL
  Filled 2024-01-14: qty 2

## 2024-01-14 MED ORDER — CLONIDINE HCL (ANALGESIA) 100 MCG/ML EP SOLN
EPIDURAL | Status: DC | PRN
  Administered 2024-01-14 (×2): 50 ug

## 2024-01-14 MED ORDER — ROCURONIUM BROMIDE 10 MG/ML (PF) SYRINGE
PREFILLED_SYRINGE | INTRAVENOUS | Status: DC | PRN
  Administered 2024-01-14: 50 mg via INTRAVENOUS

## 2024-01-14 MED ORDER — CHLORHEXIDINE GLUCONATE 0.12 % MT SOLN: 15.0000 mL | Freq: Once | OROMUCOSAL | Status: DC

## 2024-01-14 MED ORDER — MORPHINE SULFATE (PF) 2 MG/ML IV SOLN
2.0000 mg | INTRAVENOUS | Status: DC | PRN
  Administered 2024-01-14 (×2): 2 mg via INTRAVENOUS
  Filled 2024-01-14 (×2): qty 1

## 2024-01-14 MED ORDER — OXYCODONE HCL 5 MG PO TABS: 5.0000 mg | ORAL_TABLET | Freq: Four times a day (QID) | ORAL | 0 refills | Status: DC | PRN

## 2024-01-14 MED ORDER — ENSURE PRE-SURGERY PO LIQD
296.0000 mL | Freq: Once | ORAL | Status: DC
Start: 2024-01-15 — End: 2024-01-14

## 2024-01-14 MED ORDER — CEFAZOLIN SODIUM-DEXTROSE 2-4 GM/100ML-% IV SOLN
2.0000 g | INTRAVENOUS | Status: AC
  Administered 2024-01-14: 2 g via INTRAVENOUS
  Filled 2024-01-14: qty 100

## 2024-01-14 MED ORDER — DEXAMETHASONE SODIUM PHOSPHATE 10 MG/ML IJ SOLN
INTRAMUSCULAR | Status: AC
  Filled 2024-01-14: qty 1

## 2024-01-14 MED ORDER — PHENYLEPHRINE 80 MCG/ML (10ML) SYRINGE FOR IV PUSH (FOR BLOOD PRESSURE SUPPORT)
PREFILLED_SYRINGE | INTRAVENOUS | Status: DC | PRN
  Administered 2024-01-14: 80 ug via INTRAVENOUS
  Administered 2024-01-14 (×2): 160 ug via INTRAVENOUS

## 2024-01-14 MED ORDER — METHOCARBAMOL 1000 MG/10ML IJ SOLN
500.0000 mg | Freq: Three times a day (TID) | INTRAMUSCULAR | Status: DC | PRN
  Filled 2024-01-14: qty 10

## 2024-01-14 MED ORDER — PROPOFOL 10 MG/ML IV BOLUS
INTRAVENOUS | Status: DC | PRN
  Administered 2024-01-14: 200 mg via INTRAVENOUS

## 2024-01-14 MED ORDER — LIDOCAINE 2% (20 MG/ML) 5 ML SYRINGE
INTRAMUSCULAR | Status: AC
  Filled 2024-01-14: qty 5

## 2024-01-14 MED ORDER — LACTATED RINGERS IV SOLN: INTRAVENOUS | Status: DC

## 2024-01-14 SURGICAL SUPPLY — 37 items
BAG COUNTER SPONGE SURGICOUNT (BAG) ×1 IMPLANT
BLADE CLIPPER SURG (BLADE) IMPLANT
CANISTER SUCTION 3000ML PPV (SUCTIONS) ×1 IMPLANT
CHLORAPREP W/TINT 26 (MISCELLANEOUS) ×1 IMPLANT
CLIP APPLIE 5 13 M/L LIGAMAX5 (MISCELLANEOUS) ×1 IMPLANT
COVER SURGICAL LIGHT HANDLE (MISCELLANEOUS) ×1 IMPLANT
DERMABOND ADVANCED .7 DNX12 (GAUZE/BANDAGES/DRESSINGS) ×1 IMPLANT
ELECTRODE REM PT RTRN 9FT ADLT (ELECTROSURGICAL) ×1 IMPLANT
GLOVE BIO SURGEON STRL SZ7 (GLOVE) ×1 IMPLANT
GLOVE BIOGEL PI IND STRL 7.5 (GLOVE) ×1 IMPLANT
GOWN STRL REUS W/ TWL LRG LVL3 (GOWN DISPOSABLE) ×3 IMPLANT
GRASPER SUT TROCAR 14GX15 (MISCELLANEOUS) ×1 IMPLANT
HEMOSTAT SNOW SURGICEL 2X4 (HEMOSTASIS) IMPLANT
IRRIGATION SUCT STRKRFLW 2 WTP (MISCELLANEOUS) ×1 IMPLANT
KIT BASIN OR (CUSTOM PROCEDURE TRAY) ×1 IMPLANT
KIT IMAGING PINPOINTPAQ (MISCELLANEOUS) IMPLANT
KIT TURNOVER KIT B (KITS) ×1 IMPLANT
NS IRRIG 1000ML POUR BTL (IV SOLUTION) ×1 IMPLANT
PAD ARMBOARD POSITIONER FOAM (MISCELLANEOUS) ×1 IMPLANT
PENCIL BUTTON HOLSTER BLD 10FT (ELECTRODE) IMPLANT
POUCH RETRIEVAL ECOSAC 10 (ENDOMECHANICALS) ×1 IMPLANT
SCISSORS LAP 5X35 DISP (ENDOMECHANICALS) ×1 IMPLANT
SET TUBE SMOKE EVAC HIGH FLOW (TUBING) ×1 IMPLANT
SLEEVE Z-THREAD 5X100MM (TROCAR) ×2 IMPLANT
SPECIMEN JAR SMALL (MISCELLANEOUS) ×1 IMPLANT
STRIP CLOSURE SKIN 1/2X4 (GAUZE/BANDAGES/DRESSINGS) ×1 IMPLANT
SUT ETHILON 2 0 FS 18 (SUTURE) IMPLANT
SUT MNCRL AB 4-0 PS2 18 (SUTURE) ×1 IMPLANT
SUT VICRYL 0 UR6 27IN ABS (SUTURE) ×1 IMPLANT
TOWEL GREEN STERILE (TOWEL DISPOSABLE) ×1 IMPLANT
TOWEL GREEN STERILE FF (TOWEL DISPOSABLE) ×1 IMPLANT
TRAY LAPAROSCOPIC MC (CUSTOM PROCEDURE TRAY) ×1 IMPLANT
TROCAR BALLN 12MMX100 BLUNT (TROCAR) ×1 IMPLANT
TROCAR XCEL NON-BLD 5MMX100MML (ENDOMECHANICALS) IMPLANT
TROCAR Z-THREAD OPTICAL 5X100M (TROCAR) ×1 IMPLANT
WARMER LAPAROSCOPE (MISCELLANEOUS) ×1 IMPLANT
WATER STERILE IRR 1000ML POUR (IV SOLUTION) ×1 IMPLANT

## 2024-01-14 NOTE — Discharge Instructions (Signed)

## 2024-01-14 NOTE — Anesthesia Procedure Notes (Addendum)
 Procedure Name: Intubation Date/Time: 01/14/2024 10:12 AM  Performed by: Viki Graver, CRNAPre-anesthesia Checklist: Patient identified, Emergency Drugs available, Suction available and Patient being monitored Patient Re-evaluated:Patient Re-evaluated prior to induction Oxygen Delivery Method: Circle System Utilized Preoxygenation: Pre-oxygenation with 100% oxygen Induction Type: IV induction Ventilation: Mask ventilation without difficulty Laryngoscope Size: Miller and 2 Grade View: Grade I Tube type: Oral Tube size: 7.0 mm Number of attempts: 1 Airway Equipment and Method: Stylet and Bite block Placement Confirmation: ETT inserted through vocal cords under direct vision, positive ETCO2 and breath sounds checked- equal and bilateral Secured at: 23 cm Tube secured with: Tape Dental Injury: Teeth and Oropharynx as per pre-operative assessment

## 2024-01-14 NOTE — Plan of Care (Signed)

## 2024-01-14 NOTE — H&P (Signed)
  58 year old female who presents with 3 attacks of epigastric pain in December or January March. These were at all times a day and not really in association with food. She eventually has undergone an ultrasound that showed no evidence of cholecystitis but she does have cholelithiasis. She has has a CT for calcium score that also shows cholelithiasis. She comes in today to discuss her options.  Review of Systems: A complete review of systems was obtained from the patient. I have reviewed this information and discussed as appropriate with the patient. See HPI as well for other ROS.  Review of Systems  Gastrointestinal: Positive for abdominal pain.  All other systems reviewed and are negative.  Medical History: History reviewed. No pertinent past medical history.   Past Surgical History:  Procedure Laterality Date  HYSTERECTOMY   Allergies  Allergen Reactions  Cyclobenzaprine Hcl Other (See Comments)  Causes severe joint pain  Penicillins Rash  Has patient had a PCN reaction causing immediate rash, facial/tongue/throat swelling, SOB or lightheadedness with hypotension: Unknown Has patient had a PCN reaction causing severe rash involving mucus membranes or skin necrosis: No Has patient had a PCN reaction that required hospitalization: No Has patient had a PCN reaction occurring within the last 10 years: No If all of the above answers are "NO", then may proceed with Cephalosporin use.   No current outpatient medications on file prior to visit.   Family History  Problem Relation Age of Onset  Diabetes Mother  Breast cancer Mother  Coronary Artery Disease (Blocked arteries around heart) Father  Obesity Sister  High blood pressure (Hypertension) Sister  Diabetes Sister  Breast cancer Sister    Social History   Tobacco Use  Smoking Status Never  Smokeless Tobacco Never  Marital status: Married  Tobacco Use  Smoking status: Never  Smokeless tobacco: Never  Vaping Use  Vaping  status: Never Used  Substance and Sexual Activity  Alcohol use: Yes  Drug use: Never   Objective:   Vitals:  12/31/23 1350  BP: 123/79  Pulse: 83  Temp: 36.7 C (98 F)  SpO2: 97%  Weight: 88.8 kg (195 lb 12.8 oz)  Height: 168.9 cm (5' 6.5")  PainSc: 0-No pain  PainLoc: Abdomen   Body mass index is 31.13 kg/m.  Physical Exam Vitals reviewed.  Constitutional:  Appearance: Normal appearance.  Abdominal:  Palpations: Abdomen is soft.  Tenderness: There is abdominal tenderness (mild ruq).  Hernia: No hernia is present.  Comments: Infraumblical incision healed  Neurological:  Mental Status: She is alert.   Assessment and Plan:  Gallstones  Laparoscopic cholecystectomy  I do think symptoms related to gallstones and recommended surgery   I discussed the procedure in detail. We discussed the risks and benefits of a laparoscopic cholecystectomy and possible cholangiogram including, but not limited to bleeding, infection, injury to surrounding structures such as the intestine or liver, bile leak, retained gallstones, need to convert to an open procedure, prolonged diarrhea, blood clots such as DVT, common bile duct injury, anesthesia risks, and possible need for additional procedures. The likelihood of improvement in symptoms and return to the patient's normal status is good. We discussed the typical post-operative recovery course.

## 2024-01-14 NOTE — Anesthesia Preprocedure Evaluation (Signed)
 Anesthesia Evaluation  Patient identified by MRN, date of birth, ID band Patient awake    Reviewed: Allergy & Precautions, NPO status , Patient's Chart, lab work & pertinent test results  Airway Mallampati: II  TM Distance: >3 FB Neck ROM: Full    Dental  (+) Dental Advisory Given   Pulmonary neg pulmonary ROS   breath sounds clear to auscultation       Cardiovascular negative cardio ROS  Rhythm:Regular Rate:Normal     Neuro/Psych negative neurological ROS     GI/Hepatic negative GI ROS, Neg liver ROS,,,  Endo/Other  negative endocrine ROS    Renal/GU negative Renal ROS     Musculoskeletal   Abdominal   Peds  Hematology negative hematology ROS (+)   Anesthesia Other Findings   Reproductive/Obstetrics                             Anesthesia Physical Anesthesia Plan  ASA: 2  Anesthesia Plan: General   Post-op Pain Management: Regional block*, Tylenol  PO (pre-op)* and Toradol  IV (intra-op)*   Induction: Intravenous  PONV Risk Score and Plan: 3 and Dexamethasone , Ondansetron , Midazolam  and Treatment may vary due to age or medical condition  Airway Management Planned: Oral ETT  Additional Equipment:   Intra-op Plan:   Post-operative Plan: Extubation in OR  Informed Consent: I have reviewed the patients History and Physical, chart, labs and discussed the procedure including the risks, benefits and alternatives for the proposed anesthesia with the patient or authorized representative who has indicated his/her understanding and acceptance.     Dental advisory given  Plan Discussed with: CRNA  Anesthesia Plan Comments:        Anesthesia Quick Evaluation

## 2024-01-14 NOTE — Anesthesia Procedure Notes (Signed)
 Anesthesia Regional Block: TAP block   Pre-Anesthetic Checklist: , timeout performed,  Correct Patient, Correct Site, Correct Laterality,  Correct Procedure, Correct Position, site marked,  Risks and benefits discussed,  Surgical consent,  Pre-op evaluation,  At surgeon's request and post-op pain management  Laterality: Right and Left  Prep: chloraprep       Needles:  Injection technique: Single-shot  Needle Type: Echogenic Needle     Needle Length: 9cm  Needle Gauge: 21     Additional Needles:   Procedures:,,,, ultrasound used (permanent image in chart),,    Narrative:  Start time: 01/14/2024 8:42 AM End time: 01/14/2024 8:51 AM Injection made incrementally with aspirations every 5 mL.  Performed by: Personally  Anesthesiologist: Peggy Bowens, MD

## 2024-01-14 NOTE — Op Note (Signed)
 Preoperative diagnosis: Biliary colic Postoperative diagnosis: Chronic cholecystitis Procedure: Laparoscopic cholecystectomy Surgeon: Dr. Donavan Fuchs Anesthesia: General With bilateral tap blocks Complications: None Drains: 19 French Blake drain to right upper quadrant Estimated blood loss: 400 cc Specimens: Gallbladder and contents to pathology Sponge needle count was correct at completion Disposition to recovery in stable condition  Indications:58 year old female who presents with 3 attacks of epigastric pain in December or January March. These were at all times a day and not really in association with food. She eventually has undergone an ultrasound that showed no evidence of cholecystitis but she does have cholelithiasis. She has has a CT for calcium score that also shows cholelithiasis.  We discussed proceeding with a laparoscopic cholecystectomy.  Procedure: After informed consent was obtained the patient was taken to the operating room.  She was given antibiotics.  SCDs were placed.  She was placed under general anesthesia without complication.  She was prepped and draped in standard sterile surgical fashion.  A surgical timeout was then performed.  I infiltrated Marcaine  below the umbilicus.  I made a vertical incision.  I grasped the fascia and incised this sharply.  I then entered the peritoneum bluntly without injury.  I placed a 0 Vicryl pursestring suture through the fascia and inserted a Hassan trocar.  I insufflated the abdomen to 15 mmHg pressure.  I then inserted 3 additional 5 mm trocars in the upper abdomen without difficulty.  The gallbladder was immediately noted to have pretty significant chronic cholecystitis.  Her liver was also very stiff as well.  I was able to retract it cephalad.  With some dissection I was able to remove the duodenum away from the gallbladder.  It also appeared that the gallbladder was very much on top of the common duct.  I was able to dissect this  carefully and obtain the critical view of safety.  I removed most of the gallbladder off the cystic plate.  I then clipped the artery and duct together as they were next to each other and divided then leaving 3 clips in place.  These completely traverse the duct and the duct was viable.  I then remove the gallbladder from the liver bed.  This was then placed in retrieval bag.  Once I had done this I used cautery to obtain hemostasis on the gallbladder.  There was an area at the top that as soon as I applied the cautery began bleeding.  This was clearly from a hepatic vein.  I had to place another trocar.  I attempted to stop this his clips but that was unsuccessful.  Eventually I was able to with open one of my partners retract this and I was able to cauterize this and place Surgicel snow on it and stop bleeding.  This accounts for all the blood loss during the case.  This was then hemostatic.  I left the snow in place.  I did place a 17 [place] Blake drain in the right upper quadrant and secured this with a 2-0 nylon.  I then removed the Ray-Tec's that I placed for pressure as well as the gallbladder in retrieval bag.  I removed the [facility] trocar and tied my pursestring down.  I placed an additional 0 Vicryl suture using the suture passer device to completely obliterate that defect.  I then desufflated the abdomen and removed all the trocars.  These were closed with 4-0 Monocryl and glue.  She was transferred to recovery stable.

## 2024-01-14 NOTE — TOC CM/SW Note (Signed)
 Transition of Care [facility]) - Inpatient Brief Assessment   Patient Details  Name: Karina Barnett MRN: [id number] Date of Birth: 1965/11/25  Transition of Care [facility]) CM/SW Contact:    Juliane Och, LCSW Phone Number: 01/14/2024, 2:23 PM   Clinical Narrative:  2:23 PM Per chart review, patient resides at home with spouse. Patient has a PCP and insurance. Patient does not have SNF/HH/DME history. No TOC needs were identified at this time. TOC will continue to follow and be available to assist.  Transition of Care Asessment: Insurance and Status: Insurance coverage has been reviewed Patient has primary care physician: Yes Home environment has been reviewed: Private Residence Prior level of function:: N/A Prior/Current Home Services: No current home services Social Drivers of Health Review: SDOH reviewed no interventions necessary Readmission risk has been reviewed: Yes Transition of care needs: no transition of care needs at this time

## 2024-01-14 NOTE — Interval H&P Note (Signed)
 History and Physical Interval Note:  01/14/2024 9:04 AM  Karina Barnett  has presented today for surgery, with the diagnosis of GALLSTONES.  The various methods of treatment have been discussed with the patient and family. After consideration of risks, benefits and other options for treatment, the patient has consented to  Procedure(s) with comments: LAPAROSCOPIC CHOLECYSTECTOMY (N/A) - ICG DYE LATERALITY: NA GENERAL & TAP BLOCK as a surgical intervention.  The patient's history has been reviewed, patient examined, no change in status, stable for surgery.  I have reviewed the patient's chart and labs.  Questions were answered to the patient's satisfaction.     [name]

## 2024-01-14 NOTE — Transfer of Care (Signed)
 Immediate Anesthesia Transfer of Care Note  Patient: Karina Barnett  Procedure(s) Performed: LAPAROSCOPIC CHOLECYSTECTOMY  Patient Location: PACU  Anesthesia Type:General  Level of Consciousness: awake, alert , and oriented  Airway & Oxygen Therapy: Patient Spontanous Breathing and Patient connected to face mask oxygen  Post-op Assessment: Report given to RN and Post -op Vital signs reviewed and stable  Post vital signs: Reviewed and stable  Last Vitals:  Vitals Value Taken Time  BP 155/91 01/14/24 1143  Temp    Pulse 86 01/14/24 1145  Resp 20 01/14/24 1145  SpO2 94 % 01/14/24 1145  Vitals shown include unfiled device data.  Last Pain:  Vitals:   01/14/24 0900  TempSrc:   PainSc: 0-No pain         Complications: No notable events documented.

## 2024-01-15 ENCOUNTER — Encounter (HOSPITAL_COMMUNITY): Payer: Self-pay | Admitting: General Surgery

## 2024-01-15 DIAGNOSIS — K8010 Calculus of gallbladder with chronic cholecystitis without obstruction: Secondary | ICD-10-CM | POA: Diagnosis not present

## 2024-01-15 LAB — CBC
HCT: 33.1 % — ABNORMAL LOW (ref 36.0–46.0)
Hemoglobin: 11.1 g/dL — ABNORMAL LOW (ref 12.0–15.0)
MCH: 29.7 pg (ref 26.0–34.0)
MCHC: 33.5 g/dL (ref 30.0–36.0)
MCV: 88.5 fL (ref 80.0–100.0)
Platelets: 215 10*3/uL (ref 150–400)
RBC: 3.74 MIL/uL — ABNORMAL LOW (ref 3.87–5.11)
RDW: 13.2 % (ref 11.5–15.5)
WBC: 11.6 10*3/uL — ABNORMAL HIGH (ref 4.0–10.5)
nRBC: 0 % (ref 0.0–0.2)

## 2024-01-15 LAB — SURGICAL PATHOLOGY

## 2024-01-15 MED ORDER — METHOCARBAMOL 750 MG PO TABS: 750.0000 mg | ORAL_TABLET | Freq: Three times a day (TID) | ORAL | 0 refills | Status: DC | PRN

## 2024-01-15 MED ORDER — OXYCODONE HCL 5 MG PO TABS: 5.0000 mg | ORAL_TABLET | ORAL | 0 refills | Status: DC | PRN

## 2024-01-15 NOTE — Anesthesia Postprocedure Evaluation (Signed)
 Anesthesia Post Note  Patient: Karina Barnett  Procedure(s) Performed: LAPAROSCOPIC CHOLECYSTECTOMY     Patient location during evaluation: PACU Anesthesia Type: General Level of consciousness: awake and alert Pain management: pain level controlled Vital Signs Assessment: post-procedure vital signs reviewed and stable Respiratory status: spontaneous breathing, nonlabored ventilation, respiratory function stable and patient connected to nasal cannula oxygen Cardiovascular status: blood pressure returned to baseline and stable Postop Assessment: no apparent nausea or vomiting Anesthetic complications: no   No notable events documented.  Last Vitals:  Vitals:   01/15/24 0350 01/15/24 0736  BP: (!) 104/57 109/69  Pulse: 63 72  Resp: 18   Temp: 37.4 C 36.9 C  SpO2: 96% 98%    Last Pain:  Vitals:   01/15/24 0841  TempSrc:   PainSc: 6                  Melvenia Stabs

## 2024-01-15 NOTE — Discharge Summary (Signed)
 Physician Discharge Summary  Patient ID: Alonia Dibuono MRN: [id number] DOB/AGE: [birth year] 58 y.o.  Admit date: 01/14/2024 Discharge date: 01/15/2024  Admission Diagnoses: Symptomatic cholelithiasis Chronic cholecystitis  Discharge Diagnoses:  Principal Problem:   S/P laparoscopic cholecystectomy   Discharged Condition: good  Hospital Course: 58 yof who had lap chole for significant chronic cholecystitis.  She remained overnight so we could recheck hemoglobin.  I left a drain that is just serosang with not a lot of output.  She is tol diet. I think can go home today and will dc drain.   Consults: None  Significant Diagnostic Studies: none  Treatments: surgery: lap chole  Discharge Exam: Blood pressure (!) 104/57, pulse 63, temperature 99.3 F (37.4 C), temperature source Oral, resp. rate 18, height 5' 6.5" (1.689 m), weight 88.3 kg, last menstrual period 11/16/2012, SpO2 96%. Ab soft app tender drain serosang   Disposition: Discharge disposition: 01-Home or Self Care        Allergies as of 01/15/2024       Reactions   Flexeril [cyclobenzaprine Hcl] Other (See Comments)   Causes severe joint pain   Penicillins Rash   Has patient had a PCN reaction causing immediate rash, facial/tongue/throat swelling, SOB or lightheadedness with hypotension: Unknown Has patient had a PCN reaction causing severe rash involving mucus membranes or skin necrosis: No Has patient had a PCN reaction that required hospitalization: No Has patient had a PCN reaction occurring within the last 10 years: No If all of the above answers are "NO", then may proceed with Cephalosporin use.        Medication List     TAKE these medications    CAL-MAG PO Take 1 tablet by mouth every evening.   methocarbamol 750 MG tablet Commonly known as: ROBAXIN Take 1 tablet (750 mg total) by mouth every 8 (eight) hours as needed (use for muscle cramps/pain).   multivitamin with minerals  tablet Take 1 tablet by mouth daily.   oxyCODONE  5 MG immediate release tablet Commonly known as: Oxy IR/ROXICODONE  Take 1 tablet (5 mg total) by mouth every 6 (six) hours as needed.   oxyCODONE  5 MG immediate release tablet Commonly known as: Oxy IR/ROXICODONE  Take 1 tablet (5 mg total) by mouth every 4 (four) hours as needed for moderate pain (pain score 4-6).   Vitamin D  (Ergocalciferol ) 1.25 MG (50000 UNIT) Caps capsule Commonly known as: DRISDOL Take 50,000 Units by mouth every 7 (seven) days.        Follow-up Information     [name], MD Follow up in 3 week(s).   Specialty: General Surgery Contact information: [address] [place] [address] [phone]                 Signed: [name] 01/15/2024, 7:26 AM

## 2024-01-19 ENCOUNTER — Emergency Department (HOSPITAL_COMMUNITY)

## 2024-01-19 ENCOUNTER — Inpatient Hospital Stay (HOSPITAL_COMMUNITY)
Admission: EM | Admit: 2024-01-19 | Discharge: 2024-01-22 | DRG: 948 | Disposition: A | Discharge status: Home / Self Care | Attending: General Surgery | Admitting: General Surgery

## 2024-01-19 ENCOUNTER — Other Ambulatory Visit: Payer: Self-pay

## 2024-01-19 ENCOUNTER — Telehealth: Payer: Self-pay | Admitting: Surgery

## 2024-01-19 DIAGNOSIS — R1011 Right upper quadrant pain: Secondary | ICD-10-CM | POA: Diagnosis not present

## 2024-01-19 DIAGNOSIS — Z90710 Acquired absence of both cervix and uterus: Secondary | ICD-10-CM

## 2024-01-19 DIAGNOSIS — R109 Unspecified abdominal pain: Secondary | ICD-10-CM | POA: Diagnosis not present

## 2024-01-19 DIAGNOSIS — I959 Hypotension, unspecified: Secondary | ICD-10-CM | POA: Diagnosis not present

## 2024-01-19 DIAGNOSIS — Z823 Family history of stroke: Secondary | ICD-10-CM | POA: Diagnosis not present

## 2024-01-19 DIAGNOSIS — F419 Anxiety disorder, unspecified: Secondary | ICD-10-CM | POA: Diagnosis not present

## 2024-01-19 DIAGNOSIS — Z9049 Acquired absence of other specified parts of digestive tract: Secondary | ICD-10-CM | POA: Diagnosis not present

## 2024-01-19 DIAGNOSIS — I10 Essential (primary) hypertension: Secondary | ICD-10-CM | POA: Diagnosis not present

## 2024-01-19 DIAGNOSIS — K9189 Other postprocedural complications and disorders of digestive system: Secondary | ICD-10-CM | POA: Diagnosis not present

## 2024-01-19 DIAGNOSIS — Z90722 Acquired absence of ovaries, bilateral: Secondary | ICD-10-CM | POA: Diagnosis not present

## 2024-01-19 DIAGNOSIS — G8918 Other acute postprocedural pain: Secondary | ICD-10-CM | POA: Diagnosis not present

## 2024-01-19 DIAGNOSIS — N2889 Other specified disorders of kidney and ureter: Secondary | ICD-10-CM | POA: Diagnosis not present

## 2024-01-19 DIAGNOSIS — Z9889 Other specified postprocedural states: Secondary | ICD-10-CM | POA: Diagnosis not present

## 2024-01-19 DIAGNOSIS — Z8249 Family history of ischemic heart disease and other diseases of the circulatory system: Secondary | ICD-10-CM | POA: Diagnosis not present

## 2024-01-19 DIAGNOSIS — K839 Disease of biliary tract, unspecified: Secondary | ICD-10-CM | POA: Diagnosis not present

## 2024-01-19 DIAGNOSIS — R1084 Generalized abdominal pain: Secondary | ICD-10-CM | POA: Diagnosis not present

## 2024-01-19 HISTORY — DX: Unspecified abdominal pain: R10.9

## 2024-01-19 LAB — COMPREHENSIVE METABOLIC PANEL WITH GFR
ALT: 74 U/L — ABNORMAL HIGH (ref 0–44)
AST: 40 U/L (ref 15–41)
Albumin: 3.9 g/dL (ref 3.5–5.0)
Alkaline Phosphatase: 120 U/L (ref 38–126)
Anion gap: 12 (ref 5–15)
BUN: 12 mg/dL (ref 6–20)
CO2: 23 mmol/L (ref 22–32)
Calcium: 8.9 mg/dL (ref 8.9–10.3)
Chloride: 98 mmol/L (ref 98–111)
Creatinine, Ser: 0.41 mg/dL — ABNORMAL LOW (ref 0.44–1.00)
GFR, Estimated: >60 mL/min (ref >60)
Glucose, Bld: 114 mg/dL — ABNORMAL HIGH (ref 70–99)
Potassium: 3.7 mmol/L (ref 3.5–5.1)
Sodium: 133 mmol/L — ABNORMAL LOW (ref 135–145)
Total Bilirubin: 1.2 mg/dL (ref 0.0–1.2)
Total Protein: 7 g/dL (ref 6.5–8.1)

## 2024-01-19 LAB — CBC WITH DIFFERENTIAL/PLATELET
Abs Immature Granulocytes: 0.02 10*3/uL (ref 0.00–0.07)
Basophils Absolute: 0 10*3/uL (ref 0.0–0.1)
Basophils Relative: 0 %
Eosinophils Absolute: 0.1 10*3/uL (ref 0.0–0.5)
Eosinophils Relative: 1 %
HCT: 35.9 % — ABNORMAL LOW (ref 36.0–46.0)
Hemoglobin: 11.6 g/dL — ABNORMAL LOW (ref 12.0–15.0)
Immature Granulocytes: 0 %
Lymphocytes Relative: 10 %
Lymphs Abs: 1 10*3/uL (ref 0.7–4.0)
MCH: 29 pg (ref 26.0–34.0)
MCHC: 32.3 g/dL (ref 30.0–36.0)
MCV: 89.8 fL (ref 80.0–100.0)
Monocytes Absolute: 0.6 10*3/uL (ref 0.1–1.0)
Monocytes Relative: 6 %
Neutro Abs: 7.8 10*3/uL — ABNORMAL HIGH (ref 1.7–7.7)
Neutrophils Relative %: 83 %
Platelets: 250 10*3/uL (ref 150–400)
RBC: 4 MIL/uL (ref 3.87–5.11)
RDW: 13.1 % (ref 11.5–15.5)
WBC: 9.6 10*3/uL (ref 4.0–10.5)
nRBC: 0 % (ref 0.0–0.2)

## 2024-01-19 LAB — LIPASE, BLOOD: Lipase: 24 U/L (ref 11–51)

## 2024-01-19 LAB — MAGNESIUM: Magnesium: 2 mg/dL (ref 1.7–2.4)

## 2024-01-19 MED ORDER — MELATONIN 3 MG PO TABS: 3.0000 mg | ORAL_TABLET | Freq: Every evening | ORAL | Status: DC | PRN

## 2024-01-19 MED ORDER — ONDANSETRON 4 MG PO TBDP: 4.0000 mg | ORAL_TABLET | Freq: Four times a day (QID) | ORAL | Status: DC | PRN

## 2024-01-19 MED ORDER — DIPHENHYDRAMINE HCL 25 MG PO CAPS: 25.0000 mg | ORAL_CAPSULE | Freq: Four times a day (QID) | ORAL | Status: DC | PRN

## 2024-01-19 MED ORDER — METRONIDAZOLE 500 MG/100ML IV SOLN
500.0000 mg | Freq: Two times a day (BID) | INTRAVENOUS | Status: DC
Start: 2024-01-19 — End: 2024-01-22
  Administered 2024-01-19 – 2024-01-21 (×4): 500 mg via INTRAVENOUS
  Filled 2024-01-19 (×5): qty 100

## 2024-01-19 MED ORDER — PANTOPRAZOLE SODIUM 40 MG IV SOLR
40.0000 mg | Freq: Every day | INTRAVENOUS | Status: DC
  Administered 2024-01-19 – 2024-01-20 (×2): 40 mg via INTRAVENOUS
  Filled 2024-01-19 (×2): qty 10

## 2024-01-19 MED ORDER — KETOROLAC TROMETHAMINE 30 MG/ML IJ SOLN
30.0000 mg | Freq: Four times a day (QID) | INTRAMUSCULAR | Status: DC
  Administered 2024-01-19 – 2024-01-21 (×7): 30 mg via INTRAVENOUS
  Filled 2024-01-19 (×7): qty 1

## 2024-01-19 MED ORDER — DOCUSATE SODIUM 100 MG PO CAPS
100.0000 mg | ORAL_CAPSULE | Freq: Two times a day (BID) | ORAL | Status: DC
  Administered 2024-01-19 – 2024-01-21 (×5): 100 mg via ORAL
  Filled 2024-01-19 (×5): qty 1

## 2024-01-19 MED ORDER — SIMETHICONE 80 MG PO CHEW: 40.0000 mg | CHEWABLE_TABLET | Freq: Four times a day (QID) | ORAL | Status: DC | PRN

## 2024-01-19 MED ORDER — ACETAMINOPHEN 500 MG PO TABS
1000.0000 mg | ORAL_TABLET | Freq: Four times a day (QID) | ORAL | Status: DC
  Administered 2024-01-19 – 2024-01-21 (×6): 1000 mg via ORAL
  Filled 2024-01-19 (×6): qty 2

## 2024-01-19 MED ORDER — SODIUM CHLORIDE 0.9 % IV SOLN: INTRAVENOUS | Status: AC

## 2024-01-19 MED ORDER — LACTATED RINGERS IV BOLUS
1000.0000 mL | Freq: Once | INTRAVENOUS | Status: AC
  Administered 2024-01-19: 1000 mL via INTRAVENOUS

## 2024-01-19 MED ORDER — HYDROMORPHONE HCL 1 MG/ML IJ SOLN
1.0000 mg | Freq: Once | INTRAMUSCULAR | Status: AC
  Administered 2024-01-19: 1 mg via INTRAVENOUS
  Filled 2024-01-19: qty 1

## 2024-01-19 MED ORDER — HYDROMORPHONE HCL 1 MG/ML IJ SOLN
0.5000 mg | Freq: Once | INTRAMUSCULAR | Status: AC
  Administered 2024-01-19: 0.5 mg via INTRAVENOUS
  Filled 2024-01-19: qty 1

## 2024-01-19 MED ORDER — HYDROMORPHONE HCL 1 MG/ML IJ SOLN: 1.0000 mg | INTRAMUSCULAR | Status: DC | PRN

## 2024-01-19 MED ORDER — TRAMADOL HCL 50 MG PO TABS: 50.0000 mg | ORAL_TABLET | Freq: Four times a day (QID) | ORAL | Status: DC | PRN

## 2024-01-19 MED ORDER — GABAPENTIN 100 MG PO CAPS
300.0000 mg | ORAL_CAPSULE | Freq: Three times a day (TID) | ORAL | Status: DC
  Administered 2024-01-19 – 2024-01-20 (×5): 300 mg via ORAL
  Filled 2024-01-19 (×3): qty 3
  Filled 2024-01-19: qty 1
  Filled 2024-01-19: qty 3

## 2024-01-19 MED ORDER — ONDANSETRON HCL 4 MG/2ML IJ SOLN: 4.0000 mg | Freq: Four times a day (QID) | INTRAMUSCULAR | Status: DC | PRN

## 2024-01-19 MED ORDER — ENOXAPARIN SODIUM 40 MG/0.4ML IJ SOSY
40.0000 mg | PREFILLED_SYRINGE | INTRAMUSCULAR | Status: DC
  Administered 2024-01-20 – 2024-01-21 (×2): 40 mg via SUBCUTANEOUS
  Filled 2024-01-19 (×2): qty 0.4

## 2024-01-19 MED ORDER — BISACODYL 10 MG RE SUPP: 10.0000 mg | Freq: Every day | RECTAL | Status: DC | PRN

## 2024-01-19 MED ORDER — OXYCODONE HCL 5 MG PO TABS: 5.0000 mg | ORAL_TABLET | ORAL | Status: DC | PRN

## 2024-01-19 MED ORDER — IOHEXOL 300 MG/ML  SOLN
100.0000 mL | Freq: Once | INTRAMUSCULAR | Status: AC | PRN
  Administered 2024-01-19: 100 mL via INTRAVENOUS

## 2024-01-19 MED ORDER — HYDRALAZINE HCL 20 MG/ML IJ SOLN: 10.0000 mg | INTRAMUSCULAR | Status: DC | PRN

## 2024-01-19 MED ORDER — KETOROLAC TROMETHAMINE 15 MG/ML IJ SOLN
15.0000 mg | Freq: Once | INTRAMUSCULAR | Status: AC
  Administered 2024-01-19: 15 mg via INTRAVENOUS
  Filled 2024-01-19: qty 1

## 2024-01-19 MED ORDER — METHOCARBAMOL 1000 MG/10ML IJ SOLN
500.0000 mg | Freq: Four times a day (QID) | INTRAMUSCULAR | Status: DC
  Administered 2024-01-19 – 2024-01-21 (×7): 500 mg via INTRAVENOUS
  Filled 2024-01-19 (×7): qty 10

## 2024-01-19 MED ORDER — METOPROLOL TARTRATE 5 MG/5ML IV SOLN: 5.0000 mg | Freq: Four times a day (QID) | INTRAVENOUS | Status: DC | PRN

## 2024-01-19 MED ORDER — METHOCARBAMOL 500 MG PO TABS
1000.0000 mg | ORAL_TABLET | Freq: Once | ORAL | Status: AC
  Administered 2024-01-19: 1000 mg via ORAL
  Filled 2024-01-19: qty 2

## 2024-01-19 MED ORDER — DIPHENHYDRAMINE HCL 50 MG/ML IJ SOLN: 25.0000 mg | Freq: Four times a day (QID) | INTRAMUSCULAR | Status: DC | PRN

## 2024-01-19 MED ORDER — SODIUM CHLORIDE 0.9 % IV SOLN
2.0000 g | INTRAVENOUS | Status: DC
  Administered 2024-01-19 – 2024-01-21 (×3): 2 g via INTRAVENOUS
  Filled 2024-01-19 (×3): qty 20

## 2024-01-19 NOTE — ED Triage Notes (Addendum)
 Patient Karina Barnett form home. Patient states she had Gallbladder surgery on 01/14/2024. Pt states that her Gallbladder was attached to 3 different organs. Patient also states that a JP tube was placed to help drain fluid. About 90cc was drained per patient. Patient was discharged on Wednesday 01/15/2024 and JP tube was removed. Patient c/o right abd pain that radiated to right shoulder. EMS placed 20g R forearm and gave 2 of fentanyl .

## 2024-01-19 NOTE — Progress Notes (Signed)
 Subjective/Chief Complaint: Patient is postop day 5 from laparoscopic cholecystectomy for severe chronic cholecystitis.  Had been doing fairly well until yesterday afternoon when she had an episode of muscle spasm type right upper quadrant pain.  This lasted about 45 minutes.  Recurred around 1:30 in the morning with pain in the right upper quadrant radiating to the shoulder and back which is a severe, spasm type discomfort.  Some nausea from the severity of the pain but no emesis.  No fevers.  Patient called and was advised to come to the ER which she subsequently has done.  Has undergone labs and imaging as below.  Reports pain medications given in the emergency department transiently helped but pain is pretty much refractory at this point.   Objective: Vital signs in last 24 hours: Temp:  [98.4 F (36.9 C)-98.8 F (37.1 C)] 98.8 F (37.1 C) (05/25 1524) Pulse Rate:  [65-107] 107 (05/25 1524) Resp:  [20-30] 30 (05/25 1524) BP: (150-176)/(76-96) 160/96 (05/25 1500) SpO2:  [89 %-97 %] 95 % (05/25 1524)    Intake/Output from previous day: No intake/output data recorded. Intake/Output this shift: Total I/O In: 1000 [IV Piggyback:1000] Out: -   Alert, tearful but no acute distress Unlabored respirations Abdomen is soft, nondistended, tender in the right upper quadrant without guarding.  Incisions are clean, dry and intact without cellulitis, hematoma or other complicating feature.  Lab Results:  Recent Labs    01/19/24 1046  WBC 9.6  HGB 11.6*  HCT 35.9*  PLT 250   BMET Recent Labs    01/19/24 1046  NA 133*  K 3.7  CL 98  CO2 23  GLUCOSE 114*  BUN 12  CREATININE 0.41*  CALCIUM 8.9   PT/INR No results for input(s): "LABPROT", "INR" in the last 72 hours. ABG No results for input(s): "PHART", "HCO3" in the last 72 hours.  Invalid input(s): "PCO2", "PO2"  Studies/Results: CT ABDOMEN PELVIS W CONTRAST Result Date: 01/19/2024 CLINICAL DATA:  Abdominal pain.  Recent gallbladder surgery 01/14/2024. EXAM: CT ABDOMEN AND PELVIS WITH CONTRAST TECHNIQUE: Multidetector CT imaging of the abdomen and pelvis was performed using the standard protocol following bolus administration of intravenous contrast. RADIATION DOSE REDUCTION: This exam was performed according to the departmental dose-optimization program which includes automated exposure control, adjustment of the mA and/or kV according to patient size and/or use of iterative reconstruction technique. CONTRAST:  OMNIPAQUE  IOHEXOL  300 MG/ML  SOLN COMPARISON:  None Available. FINDINGS: Lower chest: Lung bases demonstrate mild heterogeneous opacification over the posterior lung bases likely atelectasis although infection is possible. Hepatobiliary: There are surgical clips over the gallbladder fossa compatible patient's recent cholecystectomy. Air and fluid are present over the gallbladder fossa with a small amount of perihepatic fluid. Biliary tree is unremarkable. No rim enhancing fluid collection to suggest abscess. Pancreas: Normal. Spleen: Normal. Adrenals/Urinary Tract: Adrenal glands are normal. Ectopic low-lying right kidney. Kidneys are normal size. No evidence of hydronephrosis or nephrolithiasis. Ureters and bladder are normal. Stomach/Bowel: Stomach and small bowel are normal. Appendix is normal. Vascular/Lymphatic: Abdominal aorta is normal in caliber. Remaining vascular structures are unremarkable. No adenopathy. Reproductive: Status post hysterectomy. No adnexal masses. Other: None. Musculoskeletal: No acute findings. IMPRESSION: 1. Evidence of recent cholecystectomy. Air and fluid are present over the gallbladder fossa with a small amount of perihepatic fluid. Findings in the gallbladder fossa may be due to residual postoperative changes although evolving infection/phlegmon is possible. No rim enhancing fluid collection to suggest abscess. 2. Ectopic low-lying right kidney.  3. Mild heterogeneous  opacification over the posterior lung bases likely atelectasis, although infection is possible. Electronically Signed   By: Roda Cirri M.D.   On: 01/19/2024 14:59    Anti-infectives: Anti-infectives (From admission, onward)    None       Assessment/Plan:  58 year old woman, postop day 5 from laparoscopic cholecystectomy with intraoperative findings of chronic cholecystitis and bleeding from hepatic vein along the top of the gallbladder requiring clip, cautery, pressure and Surgicel snow placement.  She was discharged postop day 1 and her drain removed prior to discharge as it had serosanguineous, low volume output.   Severe, at this point intractable right upper quadrant pain radiating to the shoulder and back today. She is afebrile, intermittently tachycardic here and hypertensive, saturating well on room air.  Labs are reassuring with a normal white count, stable hemoglobin, normal creatinine and normal bicarb, unremarkable LFTs.  I reviewed her CT images personally.  There is some fluid and air present in the gallbladder fossa as well as a small amount of fluid over the dome of the liver.  Suspect residual blood products/ snow, low likelihood of infection given vitals and labs.   - Admit to general surgery service - Multimodal pain control, fluid resuscitation - Empiric antibiotics - HIDA scan to rule out bile leak   LOS: 0 days    Adalberto Acton 01/19/2024

## 2024-01-19 NOTE — ED Provider Notes (Signed)
 [place] EMERGENCY DEPARTMENT AT [facility] Provider Note   CSN: [id number] Arrival date & time: 01/19/24  4098     History  Chief Complaint  Patient presents with   Abdominal Pain    Karina Barnett is a 58 y.o. female.  HPI Patient presents for abdominal pain. Medical history includes anemia, anxiety.  She is 5 days postop from laparoscopic cholecystectomy.  Following surgery, she had a JP drain in place.  This was removed the following day.  She has had ongoing intermittent spasms to her right upper quadrant, radiating to right shoulder blade.  These acutely worsened yesterday.  Pain has been refractory to her home oxycodone .  When they occur, pain is 10/10 in severity.  She denies any associated nausea, fevers, chills.    Home Medications Prior to Admission medications   Medication Sig Start Date End Date Taking? Authorizing Provider  Calcium-Magnesium (CAL-MAG PO) Take 1 tablet by mouth every evening.    [provider]  methocarbamol (ROBAXIN) 750 MG tablet Take 1 tablet (750 mg total) by mouth every 8 (eight) hours as needed (use for muscle cramps/pain). 01/15/24   [name], MD  Multiple Vitamins-Minerals (MULTIVITAMIN WITH MINERALS) tablet Take 1 tablet by mouth daily.    [provider]  oxyCODONE  (OXY IR/ROXICODONE ) 5 MG immediate release tablet Take 1 tablet (5 mg total) by mouth every 6 (six) hours as needed. 01/14/24   [name], MD  oxyCODONE  (OXY IR/ROXICODONE ) 5 MG immediate release tablet Take 1 tablet (5 mg total) by mouth every 4 (four) hours as needed for moderate pain (pain score 4-6). 01/15/24   [name], MD  Vitamin D , Ergocalciferol , (DRISDOL) 1.25 MG (50000 UNIT) CAPS capsule Take 50,000 Units by mouth every 7 (seven) days.    [provider]      Allergies    Flexeril [cyclobenzaprine hcl] and Penicillins    Review of Systems   Review of Systems  Gastrointestinal:  Positive for  abdominal pain.  All other systems reviewed and are negative.   Physical Exam Updated Vital Signs BP (!) 160/96   Pulse (!) 107   Temp 98.8 F (37.1 C) (Oral)   Resp (!) 30   LMP 11/16/2012   SpO2 95%  Physical Exam Vitals and nursing note reviewed.  Constitutional:      General: She is not in acute distress.    Appearance: She is well-developed. She is not ill-appearing, toxic-appearing or diaphoretic.  HENT:     Head: Normocephalic and atraumatic.     Mouth/Throat:     Mouth: Mucous membranes are moist.  Eyes:     General: No scleral icterus.    Extraocular Movements: Extraocular movements intact.     Conjunctiva/sclera: Conjunctivae normal.  Cardiovascular:     Rate and Rhythm: Normal rate and regular rhythm.  Pulmonary:     Effort: Pulmonary effort is normal. No respiratory distress.  Abdominal:     Palpations: Abdomen is soft.     Tenderness: There is abdominal tenderness in the right upper quadrant.  Musculoskeletal:        General: No swelling.     Cervical back: Neck supple.  Skin:    General: Skin is warm and dry.  Neurological:     General: No focal deficit present.     Mental Status: She is alert and oriented to person, place, and time.  Psychiatric:        Mood and Affect: Mood normal.  Behavior: Behavior normal.     ED Results / Procedures / Treatments   Labs (all labs ordered are listed, but only abnormal results are displayed) Labs Reviewed  COMPREHENSIVE METABOLIC PANEL WITH GFR - Abnormal; Notable for the following components:      Result Value   Sodium 133 (*)    Glucose, Bld 114 (*)    Creatinine, Ser 0.41 (*)    ALT 74 (*)    All other components within normal limits  CBC WITH DIFFERENTIAL/PLATELET - Abnormal; Notable for the following components:   Hemoglobin 11.6 (*)    HCT 35.9 (*)    Neutro Abs 7.8 (*)    All other components within normal limits  LIPASE, BLOOD  MAGNESIUM    EKG None  Radiology CT ABDOMEN PELVIS W  CONTRAST Result Date: 01/19/2024 CLINICAL DATA:  Abdominal pain. Recent gallbladder surgery 01/14/2024. EXAM: CT ABDOMEN AND PELVIS WITH CONTRAST TECHNIQUE: Multidetector CT imaging of the abdomen and pelvis was performed using the standard protocol following bolus administration of intravenous contrast. RADIATION DOSE REDUCTION: This exam was performed according to the departmental dose-optimization program which includes automated exposure control, adjustment of the mA and/or kV according to patient size and/or use of iterative reconstruction technique. CONTRAST:  100mL OMNIPAQUE IOHEXOL 300 MG/ML  SOLN COMPARISON:  None Available. FINDINGS: Lower chest: Lung bases demonstrate mild heterogeneous opacification over the posterior lung bases likely atelectasis although infection is possible. Hepatobiliary: There are surgical clips over the gallbladder fossa compatible patient's recent cholecystectomy. Air and fluid are present over the gallbladder fossa with a small amount of perihepatic fluid. Biliary tree is unremarkable. No rim enhancing fluid collection to suggest abscess. Pancreas: Normal. Spleen: Normal. Adrenals/Urinary Tract: Adrenal glands are normal. Ectopic low-lying right kidney. Kidneys are normal size. No evidence of hydronephrosis or nephrolithiasis. Ureters and bladder are normal. Stomach/Bowel: Stomach and small bowel are normal. Appendix is normal. Vascular/Lymphatic: Abdominal aorta is normal in caliber. Remaining vascular structures are unremarkable. No adenopathy. Reproductive: Status post hysterectomy. No adnexal masses. Other: None. Musculoskeletal: No acute findings. IMPRESSION: 1. Evidence of recent cholecystectomy. Air and fluid are present over the gallbladder fossa with a small amount of perihepatic fluid. Findings in the gallbladder fossa may be due to residual postoperative changes although evolving infection/phlegmon is possible. No rim enhancing fluid collection to suggest abscess. 2.  Ectopic low-lying right kidney. 3. Mild heterogeneous opacification over the posterior lung bases likely atelectasis, although infection is possible. Electronically Signed   By: Roda Cirri M.D.   On: 01/19/2024 14:59    Procedures Procedures    Medications Ordered in ED Medications  enoxaparin (LOVENOX) injection 40 mg (has no administration in time range)  0.9 %  sodium chloride  infusion (has no administration in time range)  acetaminophen  (TYLENOL ) tablet 1,000 mg (has no administration in time range)  ketorolac  (TORADOL ) 30 MG/ML injection 30 mg (has no administration in time range)  traMADol (ULTRAM) tablet 50 mg (has no administration in time range)  oxyCODONE  (Oxy IR/ROXICODONE ) immediate release tablet 5-10 mg (has no administration in time range)  HYDROmorphone  (DILAUDID ) injection 1 mg (has no administration in time range)  methocarbamol (ROBAXIN) injection 500 mg (has no administration in time range)  melatonin tablet 3 mg (has no administration in time range)  diphenhydrAMINE  (BENADRYL ) capsule 25 mg (has no administration in time range)    Or  diphenhydrAMINE  (BENADRYL ) injection 25 mg (has no administration in time range)  docusate sodium  (COLACE) capsule 100 mg (has no administration in  time range)  bisacodyl (DULCOLAX) suppository 10 mg (has no administration in time range)  ondansetron  (ZOFRAN -ODT) disintegrating tablet 4 mg (has no administration in time range)    Or  ondansetron  (ZOFRAN ) injection 4 mg (has no administration in time range)  simethicone (MYLICON) chewable tablet 40 mg (has no administration in time range)  pantoprazole  (PROTONIX ) injection 40 mg (has no administration in time range)  metoprolol tartrate (LOPRESSOR) injection 5 mg (has no administration in time range)  hydrALAZINE (APRESOLINE) injection 10 mg (has no administration in time range)  gabapentin (NEURONTIN) capsule 300 mg (has no administration in time range)  lactated ringers  bolus  1,000 mL (0 mLs Intravenous Stopped 01/19/24 1237)  HYDROmorphone  (DILAUDID ) injection 1 mg (1 mg Intravenous Given 01/19/24 1047)  HYDROmorphone  (DILAUDID ) injection 0.5 mg (0.5 mg Intravenous Given 01/19/24 1148)  iohexol (OMNIPAQUE) 300 MG/ML solution 100 mL (100 mLs Intravenous Contrast Given 01/19/24 1400)  HYDROmorphone  (DILAUDID ) injection 0.5 mg (0.5 mg Intravenous Given 01/19/24 1334)  ketorolac  (TORADOL ) 15 MG/ML injection 15 mg (15 mg Intravenous Given 01/19/24 1523)  methocarbamol (ROBAXIN) tablet 1,000 mg (1,000 mg Oral Given 01/19/24 1523)    ED Course/ Medical Decision Making/ A&P                                 Medical Decision Making Amount and/or Complexity of Data Reviewed Labs: ordered. Radiology: ordered.  Risk Prescription drug management. Decision regarding hospitalization.   This patient presents to the ED for concern of right upper quadrant pain, this involves an extensive number of treatment options, and is a complaint that carries with it a high risk of complications and morbidity.  The differential diagnosis includes expected postoperative pain, postoperative complication, gallstones, hepatitis, pancreatitis, nephrolithiasis   Co morbidities / Chronic conditions that complicate the patient evaluation  Anemia, anxiety   Additional history obtained:  Additional history obtained from EMR External records from outside source obtained and reviewed including N/A   Lab Tests:  I Ordered, and personally interpreted labs.  The pertinent results include: No leukocytosis, normal hepatobiliary enzymes, normal lipase   Imaging Studies ordered:  I ordered imaging studies including CT of abdomen and pelvis I independently visualized and interpreted imaging which showed small amount of air and fluid over gallbladder fossa with small amount of perihepatic fluid.  No rim-enhancing fluid collection. I agree with the radiologist interpretation   Cardiac Monitoring: /  EKG:  The patient was maintained on a cardiac monitor.  I personally viewed and interpreted the cardiac monitored which showed an underlying rhythm of: Sinus rhythm   Problem List / ED Course / Critical interventions / Medication management  Patient presenting for right upper quadrant abdominal pain, radiating to right shoulder blade.  This has been ongoing since her recent surgery 5 days ago.  She underwent laparoscopic cholecystectomy.  On arrival in the ED, she is overall well-appearing.  While speaking with her, she will grimace in pain when she has what she describes as a spasm type severe pain to her right upper quadrant.  These have been frequent since yesterday.  She took a Percocet this morning and got 200 mcg of fentanyl  with EMS.  Despite this, she still has 10/10 severity spasms.  Additional pain medication was ordered.  Workup was initiated.  Lab work is all reassuring.  CT scan showed air and fluid present over gallbladder fossa in addition to the small amount of perihepatic fluid without  rim enhancement.  Patient had refractory pain despite multiple doses of Dilaudid .  General surgery was consulted.  General surgery evaluated the patient and will admit for HIDA scan and ongoing symptomatic relief. I ordered medication including Dilaudid , Toradol , Robaxin for analgesia; IV fluids for hydration Reevaluation of the patient after these medicines showed that the patient improved I have reviewed the patients home medicines and have made adjustments as needed   Consultations Obtained:  I requested consultation with the general surgery,  and discussed lab and imaging findings as well as pertinent plan - they recommend: Admission to their service for HIDA scan and pain control   Social Determinants of Health:  Has access to outpatient care        Final Clinical Impression(s) / ED Diagnoses Final diagnoses:  Right upper quadrant abdominal pain    Rx / DC Orders ED Discharge  Orders     None         Iva Mariner, MD 01/19/24 1624

## 2024-01-19 NOTE — Telephone Encounter (Signed)
 Patient has been called reporting patient has intractable muscle spasm type pain in the right upper quadrant radiating to her back.  This started around 1:30 in the morning and has not been relieved with muscle relaxer or other pain medication.  Some associated nausea, no fever.  Drain was removed prior to discharge from lap chole last week.  Patient instructed to present to the emergency room to be evaluated given intractable pain.

## 2024-01-20 LAB — CBC
HCT: 31.6 % — ABNORMAL LOW (ref 36.0–46.0)
Hemoglobin: 10.2 g/dL — ABNORMAL LOW (ref 12.0–15.0)
MCH: 29.2 pg (ref 26.0–34.0)
MCHC: 32.3 g/dL (ref 30.0–36.0)
MCV: 90.5 fL (ref 80.0–100.0)
Platelets: 220 10*3/uL (ref 150–400)
RBC: 3.49 MIL/uL — ABNORMAL LOW (ref 3.87–5.11)
RDW: 13.4 % (ref 11.5–15.5)
WBC: 8.5 10*3/uL (ref 4.0–10.5)
nRBC: 0 % (ref 0.0–0.2)

## 2024-01-20 LAB — COMPREHENSIVE METABOLIC PANEL WITH GFR
ALT: 54 U/L — ABNORMAL HIGH (ref 0–44)
AST: 25 U/L (ref 15–41)
Albumin: 3.2 g/dL — ABNORMAL LOW (ref 3.5–5.0)
Alkaline Phosphatase: 105 U/L (ref 38–126)
Anion gap: 9 (ref 5–15)
BUN: 11 mg/dL (ref 6–20)
CO2: 24 mmol/L (ref 22–32)
Calcium: 8.6 mg/dL — ABNORMAL LOW (ref 8.9–10.3)
Chloride: 103 mmol/L (ref 98–111)
Creatinine, Ser: 0.63 mg/dL (ref 0.44–1.00)
GFR, Estimated: >60 mL/min (ref >60)
Glucose, Bld: 112 mg/dL — ABNORMAL HIGH (ref 70–99)
Potassium: 3.9 mmol/L (ref 3.5–5.1)
Sodium: 136 mmol/L (ref 135–145)
Total Bilirubin: 1 mg/dL (ref 0.0–1.2)
Total Protein: 5.8 g/dL — ABNORMAL LOW (ref 6.5–8.1)

## 2024-01-20 LAB — MAGNESIUM: Magnesium: 2.3 mg/dL (ref 1.7–2.4)

## 2024-01-20 NOTE — Plan of Care (Signed)
   Problem: Education: Goal: Knowledge of General Education information will improve Description: Including pain rating scale, medication(s)/side effects and non-pharmacologic comfort measures Outcome: Progressing   Problem: Coping: Goal: Level of anxiety will decrease Outcome: Progressing

## 2024-01-20 NOTE — Progress Notes (Addendum)
 Subjective/Chief Complaint: Pain is much better today, was able to sleep   Objective: Vital signs in last 24 hours: Temp:  [98.1 F (36.7 C)-98.8 F (37.1 C)] 98.4 F (36.9 C) (05/26 0541) Pulse Rate:  [65-107] 71 (05/26 0541) Resp:  [18-30] 18 (05/26 0541) BP: (110-176)/(63-96) 114/72 (05/26 0541) SpO2:  [89 %-98 %] 98 % (05/26 0541) Last BM Date : 01/18/24  Intake/Output from previous day: 05/25 0701 - 05/26 0700 In: 1396.4 [I.V.:396.4; IV Piggyback:1000] Out: 600 [Urine:600] Intake/Output this shift: No intake/output data recorded.  Alert, appears comfortable Unlabored respirations   Lab Results:  Recent Labs    01/19/24 1046 01/20/24 0417  WBC 9.6 8.5  HGB 11.6* 10.2*  HCT 35.9* 31.6*  PLT 250 220   BMET Recent Labs    01/19/24 1046 01/20/24 0417  NA 133* 136  K 3.7 3.9  CL 98 103  CO2 23 24  GLUCOSE 114* 112*  BUN 12 11  CREATININE 0.41* 0.63  CALCIUM 8.9 8.6*   PT/INR No results for input(s): "LABPROT", "INR" in the last 72 hours. ABG No results for input(s): "PHART", "HCO3" in the last 72 hours.  Invalid input(s): "PCO2", "PO2"  Studies/Results: CT ABDOMEN PELVIS W CONTRAST Result Date: 01/19/2024 CLINICAL DATA:  Abdominal pain. Recent gallbladder surgery 01/14/2024. EXAM: CT ABDOMEN AND PELVIS WITH CONTRAST TECHNIQUE: Multidetector CT imaging of the abdomen and pelvis was performed using the standard protocol following bolus administration of intravenous contrast. RADIATION DOSE REDUCTION: This exam was performed according to the departmental dose-optimization program which includes automated exposure control, adjustment of the mA and/or kV according to patient size and/or use of iterative reconstruction technique. CONTRAST:  100mL OMNIPAQUE IOHEXOL 300 MG/ML  SOLN COMPARISON:  None Available. FINDINGS: Lower chest: Lung bases demonstrate mild heterogeneous opacification over the posterior lung bases likely atelectasis although infection is  possible. Hepatobiliary: There are surgical clips over the gallbladder fossa compatible patient's recent cholecystectomy. Air and fluid are present over the gallbladder fossa with a small amount of perihepatic fluid. Biliary tree is unremarkable. No rim enhancing fluid collection to suggest abscess. Pancreas: Normal. Spleen: Normal. Adrenals/Urinary Tract: Adrenal glands are normal. Ectopic low-lying right kidney. Kidneys are normal size. No evidence of hydronephrosis or nephrolithiasis. Ureters and bladder are normal. Stomach/Bowel: Stomach and small bowel are normal. Appendix is normal. Vascular/Lymphatic: Abdominal aorta is normal in caliber. Remaining vascular structures are unremarkable. No adenopathy. Reproductive: Status post hysterectomy. No adnexal masses. Other: None. Musculoskeletal: No acute findings. IMPRESSION: 1. Evidence of recent cholecystectomy. Air and fluid are present over the gallbladder fossa with a small amount of perihepatic fluid. Findings in the gallbladder fossa may be due to residual postoperative changes although evolving infection/phlegmon is possible. No rim enhancing fluid collection to suggest abscess. 2. Ectopic low-lying right kidney. 3. Mild heterogeneous opacification over the posterior lung bases likely atelectasis, although infection is possible. Electronically Signed   By: Roda Cirri M.D.   On: 01/19/2024 14:59    Anti-infectives: Anti-infectives (From admission, onward)    Start     Dose/Rate Route Frequency Ordered Stop   01/19/24 1700  cefTRIAXone (ROCEPHIN) 2 g in sodium chloride  0.9 % 100 mL IVPB        2 g 200 mL/hr over 30 Minutes Intravenous Every 24 hours 01/19/24 1629     01/19/24 1700  metroNIDAZOLE (FLAGYL) IVPB 500 mg        500 mg 100 mL/hr over 60 Minutes Intravenous Every 12 hours 01/19/24 1629  Assessment/Plan:  58 year old woman, postop day 6 from laparoscopic cholecystectomy with RUQ pain and gallbladder fossa fluid/air  collection + perihepatic fluid - Multimodal pain control, fluid resuscitation - Empiric antibiotics - HIDA scan pending to rule out bile leak   LOS: 1 day    Adalberto Acton 01/20/2024

## 2024-01-20 NOTE — Plan of Care (Signed)

## 2024-01-21 ENCOUNTER — Inpatient Hospital Stay (HOSPITAL_COMMUNITY)

## 2024-01-21 MED ORDER — METHOCARBAMOL 500 MG PO TABS
500.0000 mg | ORAL_TABLET | Freq: Three times a day (TID) | ORAL | Status: DC
  Administered 2024-01-21 (×3): 500 mg via ORAL
  Filled 2024-01-21 (×3): qty 1

## 2024-01-21 MED ORDER — KETOROLAC TROMETHAMINE 30 MG/ML IJ SOLN: 30.0000 mg | Freq: Three times a day (TID) | INTRAMUSCULAR | Status: DC | PRN

## 2024-01-21 MED ORDER — METRONIDAZOLE 500 MG PO TABS
500.0000 mg | ORAL_TABLET | Freq: Once | ORAL | Status: AC
  Administered 2024-01-21: 500 mg via ORAL
  Filled 2024-01-21: qty 1

## 2024-01-21 MED ORDER — GABAPENTIN 100 MG PO CAPS
100.0000 mg | ORAL_CAPSULE | Freq: Every day | ORAL | Status: DC
  Administered 2024-01-21: 100 mg via ORAL
  Filled 2024-01-21: qty 1

## 2024-01-21 MED ORDER — PANTOPRAZOLE SODIUM 40 MG PO TBEC
40.0000 mg | DELAYED_RELEASE_TABLET | Freq: Once | ORAL | Status: AC
  Administered 2024-01-21: 40 mg via ORAL
  Filled 2024-01-21: qty 1

## 2024-01-21 MED ORDER — PANTOPRAZOLE SODIUM 40 MG PO TBEC: 40.0000 mg | DELAYED_RELEASE_TABLET | Freq: Every day | ORAL | Status: DC

## 2024-01-21 MED ORDER — ALUM & MAG HYDROXIDE-SIMETH 200-200-20 MG/5ML PO SUSP
30.0000 mL | Freq: Four times a day (QID) | ORAL | Status: DC | PRN
  Administered 2024-01-21: 30 mL via ORAL
  Filled 2024-01-21: qty 30

## 2024-01-21 MED ORDER — METRONIDAZOLE 500 MG PO TABS: 500.0000 mg | ORAL_TABLET | Freq: Once | ORAL | Status: DC

## 2024-01-21 MED ORDER — TECHNETIUM TC 99M MEBROFENIN IV KIT
5.5000 | PACK | Freq: Once | INTRAVENOUS | Status: AC
Start: 2024-01-21 — End: 2024-01-21
  Administered 2024-01-21: 5.5 via INTRAVENOUS

## 2024-01-21 NOTE — Plan of Care (Signed)
   Problem: Education: Goal: Knowledge of General Education information will improve Description Including pain rating scale, medication(s)/side effects and non-pharmacologic comfort measures Outcome: Progressing   Problem: Health Behavior/Discharge Planning: Goal: Ability to manage health-related needs will improve Outcome: Progressing

## 2024-01-21 NOTE — Progress Notes (Signed)
   01/21/24 1019  TOC Brief Assessment  Insurance and Status Reviewed  Patient has primary care physician Yes  Home environment has been reviewed home with spouse  Prior level of function: independent  Prior/Current Home Services No current home services  Social Drivers of Health Review SDOH reviewed no interventions necessary  Readmission risk has been reviewed Yes  Transition of care needs no transition of care needs at this time

## 2024-01-21 NOTE — Progress Notes (Signed)
 Subjective/Chief Complaint: Tol diet, oob yesterday, pain much better   Objective: Vital signs in last 24 hours: Temp:  [98.1 F (36.7 C)-98.7 F (37.1 C)] 98.7 F (37.1 C) (05/27 0604) Pulse Rate:  [71-91] 71 (05/27 0604) Resp:  [16-18] 18 (05/27 0604) BP: (119-132)/(64-74) 132/74 (05/27 0604) SpO2:  [94 %-100 %] 94 % (05/27 0604) Last BM Date : 01/20/24  Intake/Output from previous day: 05/26 0701 - 05/27 0700 In: 2063 [P.O.:480; I.V.:882.9; IV Piggyback:700.1] Out: 650 [Urine:650] Intake/Output this shift: No intake/output data recorded.  Ab soft incisions healing well Pulm nl effort   Lab Results:  Recent Labs    01/19/24 1046 01/20/24 0417  WBC 9.6 8.5  HGB 11.6* 10.2*  HCT 35.9* 31.6*  PLT 250 220   BMET Recent Labs    01/19/24 1046 01/20/24 0417  NA 133* 136  K 3.7 3.9  CL 98 103  CO2 23 24  GLUCOSE 114* 112*  BUN 12 11  CREATININE 0.41* 0.63  CALCIUM 8.9 8.6*   PT/INR No results for input(s): "LABPROT", "INR" in the last 72 hours. ABG No results for input(s): "PHART", "HCO3" in the last 72 hours.  Invalid input(s): "PCO2", "PO2"  Studies/Results: CT ABDOMEN PELVIS W CONTRAST Result Date: 01/19/2024 CLINICAL DATA:  Abdominal pain. Recent gallbladder surgery 01/14/2024. EXAM: CT ABDOMEN AND PELVIS WITH CONTRAST TECHNIQUE: Multidetector CT imaging of the abdomen and pelvis was performed using the standard protocol following bolus administration of intravenous contrast. RADIATION DOSE REDUCTION: This exam was performed according to the departmental dose-optimization program which includes automated exposure control, adjustment of the mA and/or kV according to patient size and/or use of iterative reconstruction technique. CONTRAST:  100mL OMNIPAQUE IOHEXOL 300 MG/ML  SOLN COMPARISON:  None Available. FINDINGS: Lower chest: Lung bases demonstrate mild heterogeneous opacification over the posterior lung bases likely atelectasis although infection is  possible. Hepatobiliary: There are surgical clips over the gallbladder fossa compatible patient's recent cholecystectomy. Air and fluid are present over the gallbladder fossa with a small amount of perihepatic fluid. Biliary tree is unremarkable. No rim enhancing fluid collection to suggest abscess. Pancreas: Normal. Spleen: Normal. Adrenals/Urinary Tract: Adrenal glands are normal. Ectopic low-lying right kidney. Kidneys are normal size. No evidence of hydronephrosis or nephrolithiasis. Ureters and bladder are normal. Stomach/Bowel: Stomach and small bowel are normal. Appendix is normal. Vascular/Lymphatic: Abdominal aorta is normal in caliber. Remaining vascular structures are unremarkable. No adenopathy. Reproductive: Status post hysterectomy. No adnexal masses. Other: None. Musculoskeletal: No acute findings. IMPRESSION: 1. Evidence of recent cholecystectomy. Air and fluid are present over the gallbladder fossa with a small amount of perihepatic fluid. Findings in the gallbladder fossa may be due to residual postoperative changes although evolving infection/phlegmon is possible. No rim enhancing fluid collection to suggest abscess. 2. Ectopic low-lying right kidney. 3. Mild heterogeneous opacification over the posterior lung bases likely atelectasis, although infection is possible. Electronically Signed   By: Roda Cirri M.D.   On: 01/19/2024 14:59    Anti-infectives: Anti-infectives (From admission, onward)    Start     Dose/Rate Route Frequency Ordered Stop   01/19/24 1700  cefTRIAXone (ROCEPHIN) 2 g in sodium chloride  0.9 % 100 mL IVPB        2 g 200 mL/hr over 30 Minutes Intravenous Every 24 hours 01/19/24 1629     01/19/24 1700  metroNIDAZOLE (FLAGYL) IVPB 500 mg        500 mg 100 mL/hr over 60 Minutes Intravenous Every 12 hours 01/19/24 1629  Assessment/Plan: 58 year old woman, postop day 6 from laparoscopic cholecystectomy with RUQ pain and gallbladder fossa fluid/air collection  + perihepatic fluid  -multimodal pain control- will make gabapentin nightly only, toradol  prn and change others to po - Empiric antibiotics can stop after HIDA scan if no leak - HIDA scan pending to rule out bile leak -wean oxygen off -dc later if hida negative   [name] 01/21/2024

## 2024-01-21 NOTE — Plan of Care (Signed)
   Problem: Education: Goal: Knowledge of General Education information will improve Description: Including pain rating scale, medication(s)/side effects and non-pharmacologic comfort measures Outcome: Progressing   Problem: Activity: Goal: Risk for activity intolerance will decrease Outcome: Progressing

## 2024-01-21 NOTE — Progress Notes (Addendum)
 MD Lanell Pinta was paged because patient is requesting for IV antibiotics and protonix  to be switched to oral. Patient wants to wait until tomorrow before ERCP to get another IV. I explained to patient that after midnight is going to be NPO but I would ask the MD. MD Lanell Pinta said to put a one time order for PO Flagyl  500 mg and switch IV Protonix  40 mg to PO.

## 2024-01-21 NOTE — Progress Notes (Signed)
 Radiology called with results from HIDA scan, message relayed to Delane Fear, MD.

## 2024-01-21 NOTE — Progress Notes (Signed)
 Patient ID: Karina Barnett, female   DOB: November 15, 1965, 58 y.o.   MRN: [id number] Hida positive. I discussed with her. I have asked her GI team Cherene Core) to see her and perform ercp. Dr Kimble Pennant is working this out as I have discussed with him

## 2024-01-22 ENCOUNTER — Inpatient Hospital Stay (HOSPITAL_COMMUNITY)

## 2024-01-22 ENCOUNTER — Encounter (HOSPITAL_COMMUNITY)
Admission: EM | Disposition: A | Discharge status: Home / Self Care | Payer: Self-pay | Source: Home / Self Care | Attending: General Surgery

## 2024-01-22 SURGERY — CANCELLED PROCEDURE

## 2024-01-22 MED ORDER — PROPOFOL 10 MG/ML IV BOLUS
INTRAVENOUS | Status: AC
  Filled 2024-01-22: qty 20

## 2024-01-22 MED ORDER — ACETAMINOPHEN 500 MG PO TABS: 1000.0000 mg | ORAL_TABLET | Freq: Four times a day (QID) | ORAL | Status: DC | PRN

## 2024-01-22 MED ORDER — PROPOFOL 1000 MG/100ML IV EMUL
INTRAVENOUS | Status: AC
Start: 2024-01-22 — End: ?
  Filled 2024-01-22: qty 100

## 2024-01-22 MED ORDER — METHOCARBAMOL 500 MG PO TABS: 500.0000 mg | ORAL_TABLET | Freq: Three times a day (TID) | ORAL | Status: DC | PRN

## 2024-01-22 MED ORDER — FENTANYL CITRATE (PF) 100 MCG/2ML IJ SOLN
INTRAMUSCULAR | Status: AC
  Filled 2024-01-22: qty 2

## 2024-01-22 MED ORDER — OXYCODONE HCL 5 MG PO TABS: 5.0000 mg | ORAL_TABLET | ORAL | 0 refills | Status: DC | PRN

## 2024-01-22 NOTE — Consult Note (Signed)
 [facility] Consultation Note  Referring Provider: [facility] Primary Care Physician:  Gabriel John, NP Primary Gastroenterologist:  Dr. Kimble Pennant  Reason for Consultation:  Bile leak  HPI: Karina Barnett is a 58 y.o. female with progressive abdominal pain and imaging (HIDA) consistent with bile leak.  One week post-cholecystectomy.  Feels better today.     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  The Hereditary Gene Panel offered by [facility] includes sequencing and/or deletion duplication testing of the following 43 genes: APC, ATM, AXIN2, BARD1, BMPR1A, BRCA1, BRCA2, BRIP1, CDH1, CDKN2A (p14ARF), CDKN2A (p16INK4a), CHEK2, DICER1, EPCAM (Deletion/duplication testing only), GREM1 (promoter region deletion/duplication testing on   H/O Bell's palsy 11/06/2011   In setting of FUO   Left knee pain 05/26/2014   Overweight (BMI 25.0-29.9)    S/P laparoscopic assisted vaginal hysterectomy (LAVH) 08/14/2018   Vocal cord polyp     Past Surgical History:  Procedure Laterality Date   ANTERIOR AND POSTERIOR REPAIR WITH SACROSPINOUS FIXATION N/A 08/14/2018   Procedure: ANTERIOR AND POSTERIOR REPAIR WITH SACROSPINOUS FIXATION;  Surgeon: Merryl Abraham, MD;  Location: [facility];  Service: Gynecology;  Laterality: N/A;   BLADDER SUSPENSION N/A 08/14/2018   Procedure: transobturator sling;  Surgeon: Merryl Abraham, MD;  Location: [facility];  Service: Gynecology;  Laterality: N/A;   CHOLECYSTECTOMY N/A 01/14/2024   Procedure: LAPAROSCOPIC  CHOLECYSTECTOMY;  Surgeon: [name], MD;  Location: [facility] OR;  Service: General;  Laterality: N/A;  ICG DYE LATERALITY: NA GENERAL & TAP BLOCK   COLONOSCOPY     CYSTOSCOPY N/A 08/14/2018   Procedure: CYSTOSCOPY;  Surgeon: Merryl Abraham, MD;  Location: [facility];  Service: Gynecology;  Laterality: N/A;   LAPAROSCOPIC VAGINAL HYSTERECTOMY WITH SALPINGO OOPHORECTOMY Bilateral 08/14/2018   Procedure: LAPAROSCOPIC ASSISTED VAGINAL HYSTERECTOMY WITH SALPINGO OOPHORECTOMY;  Surgeon: Merryl Abraham, MD;  Location: [facility];  Service: Gynecology;  Laterality: Bilateral;  need bed    Prior to Admission medications   Medication Sig Start Date End Date Taking? Authorizing Provider  Calcium-Magnesium (CAL-MAG PO) Take 4 tablets by mouth every evening.   Yes [provider]  methocarbamol (ROBAXIN) 750 MG tablet Take 1 tablet (750 mg total) by mouth every 8 (eight) hours as needed (use for muscle cramps/pain). 01/15/24  Yes [name], MD  Multiple Vitamins-Minerals (MULTIVITAMIN WITH MINERALS) tablet Take 1 tablet by mouth daily.   Yes [provider]  oxyCODONE  (OXY IR/ROXICODONE ) 5 MG immediate release tablet Take 1 tablet (5 mg total) by mouth every 4 (four) hours as needed for moderate pain (pain score 4-6). 01/15/24  Yes [name], MD  Probiotic Product (PROBIOTIC PO) Take 2 capsules by mouth daily.   Yes [provider]  Vitamin D , Ergocalciferol , (DRISDOL) 1.25 MG (50000 UNIT) CAPS capsule Take 50,000 Units by mouth every 7 (seven) days.   Yes [provider]    Current Facility-Administered Medications  Medication Dose Route Frequency Provider Last Rate Last Admin   acetaminophen  (TYLENOL ) tablet 1,000 mg  1,000 mg Oral Q6H PRN [name], MD       alum & mag hydroxide-simeth (MAALOX/MYLANTA) 200-200-20 MG/5ML suspension 30 mL  30 mL Oral Q6H PRN [name], MD   30 mL at 01/21/24 1605    diphenhydrAMINE  (BENADRYL ) capsule 25 mg  25 mg Oral Q6H PRN Adalberto Acton, MD       Or   diphenhydrAMINE  (BENADRYL ) injection 25 mg  25 mg Intravenous Q6H PRN Adalberto Acton, MD       docusate sodium  (COLACE) capsule 100 mg  100 mg Oral BID Aldon Hung A, MD   100 mg at 01/21/24 2140   ketorolac  (TORADOL ) 30 MG/ML injection 30 mg  30 mg Intravenous Q8H PRN [name], MD       melatonin tablet 3 mg  3 mg Oral QHS PRN Adalberto Acton, MD       methocarbamol (ROBAXIN) tablet 500 mg  500 mg Oral Q8H PRN [name], MD       metoprolol tartrate (LOPRESSOR) injection 5 mg  5 mg Intravenous Q6H PRN Adalberto Acton, MD       ondansetron  (ZOFRAN -ODT) disintegrating tablet 4 mg  4 mg Oral Q6H PRN Adalberto Acton, MD       Or   ondansetron  (ZOFRAN ) injection 4 mg  4 mg Intravenous Q6H PRN Adalberto Acton, MD       oxyCODONE  (Oxy IR/ROXICODONE ) immediate release tablet 5-10 mg  5-10 mg Oral Q4H PRN Adalberto Acton, MD       pantoprazole  (PROTONIX ) EC tablet 40 mg  40 mg Oral Daily Adalberto Acton, MD       simethicone (MYLICON) chewable tablet 40 mg  40 mg Oral Q6H PRN Adalberto Acton, MD       traMADol [place] Sick) tablet 50 mg  50 mg Oral Q6H PRN Adalberto Acton, MD        Allergies as of 01/19/2024 - Review Complete 01/19/2024  Allergen Reaction Noted   Flexeril [cyclobenzaprine hcl] Other (See Comments) 11/06/2011   Penicillins Rash 11/06/2011    Family History  Problem Relation Age of Onset   Hypertension Mother    Irritable bowel syndrome Mother    Other Mother        hx of hysterectomy in her late 83s-early 74s for prolapsed uterus   Breast cancer Mother    CAD Father    Breast cancer Sister 37       DCIS   Other Sister        dx benign grape-sized tumor of abdomen   Arthritis Maternal Grandmother    Alzheimer's disease Maternal Grandmother        d. 80y   Hypertension Maternal Grandfather    Heart disease Maternal Grandfather    Heart  Problems Maternal Grandfather        open heart surgery   Stroke Maternal Grandfather        d. 98y   Skin cancer Maternal Grandfather        hx of skin burn; unspecified type   Alzheimer's disease Paternal Grandmother    Diabetes Paternal Grandmother        borderline   Lung cancer Paternal Grandfather        d. 84; tumor of bronchial arch; hx of chemical inhalation - worked in factory   Breast cancer Cousin 40       paternal 1st cousin    Pancreatic cancer Cousin 62  paternal 1st cousin d. 63y; smoker   Other Other        icthyosis    Social History   Socioeconomic History   Marital status: Married    Spouse name: Not on file   Number of children: Not on file   Years of education: Not on file   Highest education level: Doctorate  Occupational History   Not on file  Tobacco Use   Smoking status: Never   Smokeless tobacco: Never  Vaping Use   Vaping status: Never Used  Substance and Sexual Activity   Alcohol use: Yes    Comment: social, occasional   Drug use: No   Sexual activity: Yes    Birth control/protection: I.U.D.  Other Topics Concern   Not on file  Social History Narrative   Not on file   Social Drivers of Health   Financial Resource Strain: Low Risk  (09/02/2023)   Overall Financial Resource Strain (CARDIA)    Difficulty of Paying Living Expenses: Not very hard  Food Insecurity: No Food Insecurity (01/19/2024)   Hunger Vital Sign    Worried About Running Out of Food in the Last Year: Never true    Ran Out of Food in the Last Year: Never true  Transportation Needs: No Transportation Needs (01/19/2024)   PRAPARE - Administrator, Civil Service (Medical): No    Lack of Transportation (Non-Medical): No  Physical Activity: Unknown (09/02/2023)   Exercise Vital Sign    Days of Exercise per Week: 0 days    Minutes of Exercise per Session: Not on file  Stress: Stress Concern Present (09/02/2023)   [employer] of Occupational Health -  Occupational Stress Questionnaire    Feeling of Stress : To some extent  Social Connections: Socially Integrated (01/19/2024)   Social Connection and Isolation Panel [NHANES]    Frequency of Communication with Friends and Family: More than three times a week    Frequency of Social Gatherings with Friends and Family: More than three times a week    Attends Religious Services: More than 4 times per year    Active Member of Clubs or Organizations: Yes    Attends Banker Meetings: More than 4 times per year    Marital Status: Married  Catering manager Violence: Not At Risk (01/19/2024)   Humiliation, Afraid, Rape, and Kick questionnaire    Fear of Current or Ex-Partner: No    Emotionally Abused: No    Physically Abused: No    Sexually Abused: No    Review of Systems: As per HPI, all others negative  Physical Exam: Vital signs in last 24 hours: Temp:  [98.1 F (36.7 C)-99.1 F (37.3 C)] 99 F (37.2 C) (05/28 0606) Pulse Rate:  [69-94] 81 (05/28 0606) Resp:  [15-18] 18 (05/28 0606) BP: (124-134)/(71-83) 124/83 (05/28 0606) SpO2:  [95 %-98 %] 95 % (05/28 0606) Last BM Date : 01/20/24 General:   Alert,  Well-developed, well-nourished, pleasant and cooperative in NAD Head:  Normocephalic and atraumatic. Eyes:  Sclera clear, no icterus.   Conjunctiva pink. Ears:  Normal auditory acuity. Nose:  No deformity, discharge,  or lesions. Mouth:  No deformity or lesions.  Oropharynx pink & moist. Neck:  Supple; no masses or thyromegaly. Lungs:  No visible respiratory distress Abdomen:  Soft, protuberant, minimal abdominal tenderness, No masses, hepatosplenomegaly or hernias noted. No peritonitis, and without rebound.     Msk:  Symmetrical without gross deformities. Normal posture. Pulses:  Normal pulses  noted. Extremities:  Without clubbing or edema. Neurologic:  Alert and  oriented x4;  grossly normal neurologically. Skin:  Intact without significant lesions or rashes. Cervical  Nodes:  No significant cervical adenopathy. Psych:  Alert and cooperative. Normal mood and affect.   Lab Results: Recent Labs    01/19/24 1046 01/20/24 0417  WBC 9.6 8.5  HGB 11.6* 10.2*  HCT 35.9* 31.6*  PLT 250 220   BMET Recent Labs    01/19/24 1046 01/20/24 0417  NA 133* 136  K 3.7 3.9  CL 98 103  CO2 23 24  GLUCOSE 114* 112*  BUN 12 11  CREATININE 0.41* 0.63  CALCIUM 8.9 8.6*   LFT Recent Labs    01/20/24 0417  PROT 5.8*  ALBUMIN 3.2*  AST 25  ALT 54*  ALKPHOS 105  BILITOT 1.0   PT/INR No results for input(s): "LABPROT", "INR" in the last 72 hours.  Studies/Results: NM HEPATOBILIARY LEAK (POST-SURGICAL) Result Date: 01/21/2024 CLINICAL DATA:  Post surgery. Concern for bile leak. Cholecystectomy 01/14/2024 EXAM: NUCLEAR MEDICINE HEPATOBILIARY IMAGING TECHNIQUE: Sequential images of the abdomen were obtained out to 60 minutes following intravenous administration of radiopharmaceutical. RADIOPHARMACEUTICALS:  5.5 mCi Tc-34m  Choletec IV COMPARISON:  CT 01/19/2024 FINDINGS: Counts are readily excreted through into the common bile duct into the small bowel. At the end of 1 hour imaging there is a small focus of activity in the RIGHT hepatic lobe region. On more delayed imaging this activity increases in intensity over time and corresponds to the region the gallbladder fossa. IMPRESSION: Delayed activity in the gallbladder fossa during the second hour of planar imaging consistent with bile leak. Majority of counts clear through the common bile duct into the small bowel. These results will be called to the ordering clinician or representative by the Radiologist Assistant, and communication documented in the PACS or [employer]. Electronically Signed   By: Deboraha Fallow M.D.   On: 01/21/2024 14:26    Impression:   Bile leak one week post-cholecystectomy. Abdominal pain, improving.  Plan:   NPO. ERCP with Dr. Lavaughn Portland today. Risks (up to and including  bleeding, infection, perforation, pancreatitis that can be complicated by infected necrosis and death), benefits (removal of stones, alleviating blockage, decreasing risk of cholangitis or choledocholithiasis-related pancreatitis), and alternatives (watchful waiting, percutaneous transhepatic cholangiography) of ERCP were explained to patient/family in detail and patient elects to proceed.    LOS: 3 days   Willmer Fellers M  01/22/2024, 9:17 AM  Cell [phone] If no answer or after 5 PM call [phone]

## 2024-01-22 NOTE — Progress Notes (Signed)
 Karina Barnett 1:27 PM  Subjective: Patient seen and examined and her hospital computer chart reviewed and her case discussed with both Dr. Kimble Pennant and Dr. Delane Fear and currently she is asymptomatic and has not taken any pain medicine today and we had a long talk about the pluses and minuses of the procedure as well as the risk of holding off and we answered all of her questions and we discussed thoroughly her clinical course  Objective: Vital signs stable afebrile no acute distress abdomen is soft nontender no new labs  Assessment: Probable bile leak seemingly stopped currently  Plan: I offered her to proceed today as scheduled versus close outpatient observation and we checked the endoscopy schedule for Thursday and Friday of this week and there seems to be some openings at the current time that if we need to proceed we can get it done and we answered all of her questions and discussed activity and diet and I gave her my contact information including office number nurses direct number and my cell phone and after our conversation we will hold off on the procedure today let her eat and if that goes well she can go home today and let me know if pain recurs in the meantime and above all discussed with Dr. Delane Fear as well  [facility]  office [phone] After 5PM or if no answer call [phone]

## 2024-01-22 NOTE — Anesthesia Preprocedure Evaluation (Signed)
 Anesthesia Evaluation  Patient identified by MRN, date of birth, ID band Patient awake    Reviewed: Allergy & Precautions, NPO status , Patient's Chart, lab work & pertinent test results  Airway Mallampati: II  TM Distance: >3 FB Neck ROM: Full    Dental  (+) Dental Advisory Given   Pulmonary neg pulmonary ROS   breath sounds clear to auscultation       Cardiovascular negative cardio ROS  Rhythm:Regular Rate:Normal     Neuro/Psych negative neurological ROS     GI/Hepatic negative GI ROS, Neg liver ROS,,,  Endo/Other  negative endocrine ROS    Renal/GU negative Renal ROS     Musculoskeletal   Abdominal   Peds  Hematology negative hematology ROS (+)   Anesthesia Other Findings   Reproductive/Obstetrics                             Anesthesia Physical Anesthesia Plan  ASA: 2  Anesthesia Plan: General   Post-op Pain Management: Minimal or no pain anticipated   Induction: Intravenous  PONV Risk Score and Plan: 3 and Dexamethasone , Ondansetron , Midazolam  and Treatment may vary due to age or medical condition  Airway Management Planned: Oral ETT  Additional Equipment:   Intra-op Plan:   Post-operative Plan: Extubation in OR  Informed Consent: I have reviewed the patients History and Physical, chart, labs and discussed the procedure including the risks, benefits and alternatives for the proposed anesthesia with the patient or authorized representative who has indicated his/her understanding and acceptance.     Dental advisory given  Plan Discussed with: CRNA  Anesthesia Plan Comments:        Anesthesia Quick Evaluation

## 2024-01-22 NOTE — Progress Notes (Signed)
 Patient admitted to endoscopy.  After discussion with Dr. Lavaughn Portland, patient procedure has been cancelled see MD note.  Patient returned to floor.

## 2024-01-22 NOTE — Plan of Care (Signed)
   Problem: Education: Goal: Knowledge of General Education information will improve Description: Including pain rating scale, medication(s)/side effects and non-pharmacologic comfort measures Outcome: Progressing   Problem: Coping: Goal: Level of anxiety will decrease Outcome: Progressing

## 2024-01-22 NOTE — Plan of Care (Signed)
   Problem: Education: Goal: Knowledge of General Education information will improve Description Including pain rating scale, medication(s)/side effects and non-pharmacologic comfort measures Outcome: Progressing   Problem: Health Behavior/Discharge Planning: Goal: Ability to manage health-related needs will improve Outcome: Progressing

## 2024-01-22 NOTE — Progress Notes (Signed)
 Subjective/Chief Complaint: Hida with leak, tol diet, much better, minimal pain, no oxgyen   Objective: Vital signs in last 24 hours: Temp:  [98.1 F (36.7 C)-99.1 F (37.3 C)] 99 F (37.2 C) (05/28 0606) Pulse Rate:  [69-94] 81 (05/28 0606) Resp:  [15-18] 18 (05/28 0606) BP: (124-134)/(71-83) 124/83 (05/28 0606) SpO2:  [95 %-98 %] 95 % (05/28 0606) Last BM Date : 01/20/24  Intake/Output from previous day: 05/27 0701 - 05/28 0700 In: 637.4 [P.O.:600; IV Piggyback:37.4] Out: 850 [Urine:850] Intake/Output this shift: No intake/output data recorded.  Sitting in bed comfortable   Lab Results:  Recent Labs    01/19/24 1046 01/20/24 0417  WBC 9.6 8.5  HGB 11.6* 10.2*  HCT 35.9* 31.6*  PLT 250 220   BMET Recent Labs    01/19/24 1046 01/20/24 0417  NA 133* 136  K 3.7 3.9  CL 98 103  CO2 23 24  GLUCOSE 114* 112*  BUN 12 11  CREATININE 0.41* 0.63  CALCIUM 8.9 8.6*   PT/INR No results for input(s): "LABPROT", "INR" in the last 72 hours. ABG No results for input(s): "PHART", "HCO3" in the last 72 hours.  Invalid input(s): "PCO2", "PO2"  Studies/Results: NM HEPATOBILIARY LEAK (POST-SURGICAL) Result Date: 01/21/2024 CLINICAL DATA:  Post surgery. Concern for bile leak. Cholecystectomy 01/14/2024 EXAM: NUCLEAR MEDICINE HEPATOBILIARY IMAGING TECHNIQUE: Sequential images of the abdomen were obtained out to 60 minutes following intravenous administration of radiopharmaceutical. RADIOPHARMACEUTICALS:  5.5 mCi Tc-81m  Choletec IV COMPARISON:  CT 01/19/2024 FINDINGS: Counts are readily excreted through into the common bile duct into the small bowel. At the end of 1 hour imaging there is a small focus of activity in the RIGHT hepatic lobe region. On more delayed imaging this activity increases in intensity over time and corresponds to the region the gallbladder fossa. IMPRESSION: Delayed activity in the gallbladder fossa during the second hour of planar imaging consistent  with bile leak. Majority of counts clear through the common bile duct into the small bowel. These results will be called to the ordering clinician or representative by the Radiologist Assistant, and communication documented in the PACS or [employer]. Electronically Signed   By: Deboraha Fallow M.D.   On: 01/21/2024 14:26    Anti-infectives: Anti-infectives (From admission, onward)    Start     Dose/Rate Route Frequency Ordered Stop   01/21/24 2300  metroNIDAZOLE (FLAGYL) tablet 500 mg  Status:  Discontinued        500 mg Oral  Once 01/21/24 2201 01/21/24 2218   01/21/24 2245  metroNIDAZOLE (FLAGYL) tablet 500 mg       Note to Pharmacy: Substitute for IV dose tonight as pt IV lost and she is requesting not to replace until AM   500 mg Oral  Once 01/21/24 2157 01/21/24 2227   01/19/24 1700  cefTRIAXone (ROCEPHIN) 2 g in sodium chloride  0.9 % 100 mL IVPB  Status:  Discontinued        2 g 200 mL/hr over 30 Minutes Intravenous Every 24 hours 01/19/24 1629 01/22/24 0815   01/19/24 1700  metroNIDAZOLE (FLAGYL) IVPB 500 mg  Status:  Discontinued        500 mg 100 mL/hr over 60 Minutes Intravenous Every 12 hours 01/19/24 1629 01/22/24 0815       Assessment/Plan: 58 year old woman, postop day 6 from laparoscopic cholecystectomy with RUQ pain and gallbladder fossa fluid/air collection + perihepatic fluid  -multimodal pain control - appreciate GI consult and ercp today -npo for  now -I dont think needs repeat imaging/drain based on her exam today   Karina Barnett 01/22/2024

## 2024-01-23 ENCOUNTER — Telehealth: Payer: Self-pay

## 2024-01-23 NOTE — Discharge Summary (Signed)
 Physician Discharge Summary  Patient ID: Karina Barnett MRN: [id number] DOB/AGE: [birth year] 58 y.o.  Admit date: 01/19/2024 Discharge date: 01/23/2024  Admission Diagnoses: Pain after lap chole  Discharge Diagnoses:  Principal Problem:   Abdominal pain Bile leak  Discharged Condition: good  Hospital Course: 58 yof who underwent difficult lap chole week prior readmitted with pain. She had CT scan that showed some perihepatic fluid as well as some in gb fossa.  She improved with conservative therapy after being admitted by my partner.  She was off oxygen and labs were all fairly normal. She eventually had a HIDA scan on Monday that showed a bile leak. Clinically she was much better. Dr Lavaughn [place] of GI saw her and elected to hold on ERCP due to her clinical condition. She was discharged home.   Consults: GI  Significant Diagnostic Studies: ct scan  Treatments: IV hydration   Disposition: Discharge disposition: 01-Home or Self Care        Allergies as of 01/22/2024       Reactions   Flexeril [cyclobenzaprine Hcl] Other (See Comments)   Causes severe joint pain   Penicillins Rash        Medication List     TAKE these medications    CAL-MAG PO Take 4 tablets by mouth every evening.   methocarbamol 750 MG tablet Commonly known as: ROBAXIN Take 1 tablet (750 mg total) by mouth every 8 (eight) hours as needed (use for muscle cramps/pain).   multivitamin with minerals tablet Take 1 tablet by mouth daily.   oxyCODONE  5 MG immediate release tablet Commonly known as: Oxy IR/ROXICODONE  Take 1 tablet (5 mg total) by mouth every 4 (four) hours as needed for moderate pain (pain score 4-6). What changed: Another medication with the same name was added. Make sure you understand how and when to take each.   oxyCODONE  5 MG immediate release tablet Commonly known as: Oxy IR/ROXICODONE  Take 1 tablet (5 mg total) by mouth every 4 (four) hours as needed for moderate pain (pain  score 4-6). What changed: You were already taking a medication with the same name, and this prescription was added. Make sure you understand how and when to take each.   PROBIOTIC PO Take 2 capsules by mouth daily.   Vitamin D  (Ergocalciferol ) 1.25 MG (50000 UNIT) Caps capsule Commonly known as: DRISDOL Take 50,000 Units by mouth every 7 (seven) days.        Follow-up Information     [name], MD Follow up in 1 week(s).   Specialty: General Surgery Contact information: [address] [place] [address] [phone]                 Signed: [name] 01/23/2024, 8:19 AM

## 2024-01-23 NOTE — Transitions of Care (Post Inpatient/ED Visit) (Signed)
 01/23/2024  Name: Karina Barnett MRN: [id number] DOB: December 19, 1965  Today's TOC FU Call Status: Today's TOC FU Call Status:: Successful TOC FU Call Completed TOC FU Call Complete Date: 01/23/24 Patient's Name and Date of Birth confirmed.  Transition Care Management Follow-up Telephone Call Date of Discharge: 01/22/24 Discharge Facility: [facility]) Type of Discharge: Inpatient Admission Primary Inpatient Discharge Diagnosis:: RUQ pain How have you been since you were released from the hospital?: Better Any questions or concerns?: No  Items Reviewed: Did you receive and understand the discharge instructions provided?: Yes Medications obtained,verified, and reconciled?: Yes (Medications Reviewed) Any new allergies since your discharge?: No Dietary orders reviewed?: Yes Do you have support at home?: Yes People in Home [RPT]: spouse  Medications Reviewed Today: Medications Reviewed Today     Reviewed by Darrall Ellison, LPN (Licensed Practical Nurse) on 01/23/24 at 1117  Med List Status: <None>   Medication Order Taking? Sig Documenting Provider Last Dose Status Informant  Calcium-Magnesium (CAL-MAG PO) [id number] No Take 4 tablets by mouth every evening. [provider] Past Month Active Self, Spouse/Significant Other, Pharmacy Records           Med Note (CRUTHIS, CHLOE C   Mon Jan 20, 2024 12:56 PM) Has not taken in 3 weeks due to holding fro procedure.   methocarbamol (ROBAXIN) 750 MG tablet [id number] No Take 1 tablet (750 mg total) by mouth every 8 (eight) hours as needed (use for muscle cramps/pain). [name], MD 01/19/2024 Active Self, Spouse/Significant Other, Pharmacy Records  Multiple Vitamins-Minerals (MULTIVITAMIN WITH MINERALS) tablet [id number] No Take 1 tablet by mouth daily. [provider] Past Month Active Self, Spouse/Significant Other, Pharmacy Records           Med Note (CRUTHIS, CHLOE C   Mon Jan 20, 2024 12:56 PM) Has not  taken in 3 weeks due to holding fro procedure.   oxyCODONE  (OXY IR/ROXICODONE ) 5 MG immediate release tablet [id number] No Take 1 tablet (5 mg total) by mouth every 4 (four) hours as needed for moderate pain (pain score 4-6). [name], MD 01/19/2024 Active Self, Spouse/Significant Other, Pharmacy Records  oxyCODONE  (OXY IR/ROXICODONE ) 5 MG immediate release tablet [id number]  Take 1 tablet (5 mg total) by mouth every 4 (four) hours as needed for moderate pain (pain score 4-6). [name], MD  Active   Probiotic Product (PROBIOTIC PO) [id number] No Take 2 capsules by mouth daily. [provider] 01/17/2024 Active Self, Spouse/Significant Other, Pharmacy Records  Vitamin D , Ergocalciferol , (DRISDOL) 1.25 MG (50000 UNIT) CAPS capsule [id number] No Take 50,000 Units by mouth every 7 (seven) days. [provider] Past Month Active Self, Spouse/Significant Other, Pharmacy Records           Med Note (CRUTHIS, CHLOE C   Mon Jan 20, 2024 12:57 PM) Has not taken in 3 weeks due to holding fro procedure.             Home Care and Equipment/Supplies: Were Home Health Services Ordered?: NA Any new equipment or medical supplies ordered?: NA  Functional Questionnaire: Do you need assistance with bathing/showering or dressing?: No Do you need assistance with meal preparation?: No Do you need assistance with eating?: No Do you have difficulty maintaining continence: No Do you need assistance with getting out of bed/getting out of a chair/moving?: No Do you have difficulty managing or taking your medications?: No  Follow up appointments reviewed: PCP Follow-up appointment confirmed?: NA Specialist Hospital Follow-up appointment confirmed?: No Reason Specialist Follow-Up Not  Confirmed: Patient has Specialist Provider Number and will Call for Appointment Do you need transportation to your follow-up appointment?: No Do you understand care options if your condition(s) worsen?:  Yes-patient verbalized understanding    SIGNATURE Darrall Ellison, LPN [facility] Nurse Health Advisor Direct Dial [phone]

## 2024-01-24 NOTE — H&P (Signed)
 See progress note dated 5/25.

## 2024-02-04 DIAGNOSIS — M9903 Segmental and somatic dysfunction of lumbar region: Secondary | ICD-10-CM | POA: Diagnosis not present

## 2024-02-04 DIAGNOSIS — M9902 Segmental and somatic dysfunction of thoracic region: Secondary | ICD-10-CM | POA: Diagnosis not present

## 2024-02-04 DIAGNOSIS — M9905 Segmental and somatic dysfunction of pelvic region: Secondary | ICD-10-CM | POA: Diagnosis not present

## 2024-02-04 DIAGNOSIS — M9901 Segmental and somatic dysfunction of cervical region: Secondary | ICD-10-CM | POA: Diagnosis not present

## 2024-03-03 DIAGNOSIS — M9905 Segmental and somatic dysfunction of pelvic region: Secondary | ICD-10-CM | POA: Diagnosis not present

## 2024-03-03 DIAGNOSIS — M9901 Segmental and somatic dysfunction of cervical region: Secondary | ICD-10-CM | POA: Diagnosis not present

## 2024-03-03 DIAGNOSIS — M9903 Segmental and somatic dysfunction of lumbar region: Secondary | ICD-10-CM | POA: Diagnosis not present

## 2024-03-03 DIAGNOSIS — M9902 Segmental and somatic dysfunction of thoracic region: Secondary | ICD-10-CM | POA: Diagnosis not present

## 2024-03-17 DIAGNOSIS — M9903 Segmental and somatic dysfunction of lumbar region: Secondary | ICD-10-CM | POA: Diagnosis not present

## 2024-03-17 DIAGNOSIS — M9902 Segmental and somatic dysfunction of thoracic region: Secondary | ICD-10-CM | POA: Diagnosis not present

## 2024-03-17 DIAGNOSIS — M9905 Segmental and somatic dysfunction of pelvic region: Secondary | ICD-10-CM | POA: Diagnosis not present

## 2024-03-17 DIAGNOSIS — M9901 Segmental and somatic dysfunction of cervical region: Secondary | ICD-10-CM | POA: Diagnosis not present

## 2024-03-24 DIAGNOSIS — F4322 Adjustment disorder with anxiety: Secondary | ICD-10-CM | POA: Diagnosis not present

## 2024-03-31 DIAGNOSIS — M9902 Segmental and somatic dysfunction of thoracic region: Secondary | ICD-10-CM | POA: Diagnosis not present

## 2024-03-31 DIAGNOSIS — M9901 Segmental and somatic dysfunction of cervical region: Secondary | ICD-10-CM | POA: Diagnosis not present

## 2024-03-31 DIAGNOSIS — M9903 Segmental and somatic dysfunction of lumbar region: Secondary | ICD-10-CM | POA: Diagnosis not present

## 2024-03-31 DIAGNOSIS — M9905 Segmental and somatic dysfunction of pelvic region: Secondary | ICD-10-CM | POA: Diagnosis not present

## 2024-04-14 DIAGNOSIS — M9901 Segmental and somatic dysfunction of cervical region: Secondary | ICD-10-CM | POA: Diagnosis not present

## 2024-04-14 DIAGNOSIS — M9903 Segmental and somatic dysfunction of lumbar region: Secondary | ICD-10-CM | POA: Diagnosis not present

## 2024-04-14 DIAGNOSIS — M9902 Segmental and somatic dysfunction of thoracic region: Secondary | ICD-10-CM | POA: Diagnosis not present

## 2024-04-14 DIAGNOSIS — M9905 Segmental and somatic dysfunction of pelvic region: Secondary | ICD-10-CM | POA: Diagnosis not present

## 2024-04-28 DIAGNOSIS — M9901 Segmental and somatic dysfunction of cervical region: Secondary | ICD-10-CM | POA: Diagnosis not present

## 2024-04-28 DIAGNOSIS — M9905 Segmental and somatic dysfunction of pelvic region: Secondary | ICD-10-CM | POA: Diagnosis not present

## 2024-04-28 DIAGNOSIS — M9903 Segmental and somatic dysfunction of lumbar region: Secondary | ICD-10-CM | POA: Diagnosis not present

## 2024-04-28 DIAGNOSIS — M9902 Segmental and somatic dysfunction of thoracic region: Secondary | ICD-10-CM | POA: Diagnosis not present

## 2024-05-12 DIAGNOSIS — M9903 Segmental and somatic dysfunction of lumbar region: Secondary | ICD-10-CM | POA: Diagnosis not present

## 2024-05-12 DIAGNOSIS — M9901 Segmental and somatic dysfunction of cervical region: Secondary | ICD-10-CM | POA: Diagnosis not present

## 2024-05-12 DIAGNOSIS — M9902 Segmental and somatic dysfunction of thoracic region: Secondary | ICD-10-CM | POA: Diagnosis not present

## 2024-05-12 DIAGNOSIS — M9905 Segmental and somatic dysfunction of pelvic region: Secondary | ICD-10-CM | POA: Diagnosis not present

## 2024-05-26 DIAGNOSIS — M9903 Segmental and somatic dysfunction of lumbar region: Secondary | ICD-10-CM | POA: Diagnosis not present

## 2024-05-26 DIAGNOSIS — M9902 Segmental and somatic dysfunction of thoracic region: Secondary | ICD-10-CM | POA: Diagnosis not present

## 2024-05-26 DIAGNOSIS — M9901 Segmental and somatic dysfunction of cervical region: Secondary | ICD-10-CM | POA: Diagnosis not present

## 2024-05-26 DIAGNOSIS — M9905 Segmental and somatic dysfunction of pelvic region: Secondary | ICD-10-CM | POA: Diagnosis not present

## 2024-06-01 DIAGNOSIS — Z1151 Encounter for screening for human papillomavirus (HPV): Secondary | ICD-10-CM | POA: Diagnosis not present

## 2024-06-01 DIAGNOSIS — Z01419 Encounter for gynecological examination (general) (routine) without abnormal findings: Secondary | ICD-10-CM | POA: Diagnosis not present

## 2024-06-01 DIAGNOSIS — Z6832 Body mass index (BMI) 32.0-32.9, adult: Secondary | ICD-10-CM | POA: Diagnosis not present

## 2024-06-02 ENCOUNTER — Other Ambulatory Visit: Payer: Self-pay | Admitting: Obstetrics and Gynecology

## 2024-06-02 DIAGNOSIS — Z803 Family history of malignant neoplasm of breast: Secondary | ICD-10-CM

## 2024-06-03 DIAGNOSIS — Z13820 Encounter for screening for osteoporosis: Secondary | ICD-10-CM | POA: Diagnosis not present

## 2024-06-09 DIAGNOSIS — M9905 Segmental and somatic dysfunction of pelvic region: Secondary | ICD-10-CM | POA: Diagnosis not present

## 2024-06-09 DIAGNOSIS — M9901 Segmental and somatic dysfunction of cervical region: Secondary | ICD-10-CM | POA: Diagnosis not present

## 2024-06-09 DIAGNOSIS — Z1239 Encounter for other screening for malignant neoplasm of breast: Secondary | ICD-10-CM | POA: Diagnosis not present

## 2024-06-09 DIAGNOSIS — M9903 Segmental and somatic dysfunction of lumbar region: Secondary | ICD-10-CM | POA: Diagnosis not present

## 2024-06-09 DIAGNOSIS — M9902 Segmental and somatic dysfunction of thoracic region: Secondary | ICD-10-CM | POA: Diagnosis not present

## 2024-06-23 DIAGNOSIS — M9903 Segmental and somatic dysfunction of lumbar region: Secondary | ICD-10-CM | POA: Diagnosis not present

## 2024-06-23 DIAGNOSIS — M9901 Segmental and somatic dysfunction of cervical region: Secondary | ICD-10-CM | POA: Diagnosis not present

## 2024-06-23 DIAGNOSIS — M9902 Segmental and somatic dysfunction of thoracic region: Secondary | ICD-10-CM | POA: Diagnosis not present

## 2024-06-23 DIAGNOSIS — M9905 Segmental and somatic dysfunction of pelvic region: Secondary | ICD-10-CM | POA: Diagnosis not present

## 2024-07-07 DIAGNOSIS — M9902 Segmental and somatic dysfunction of thoracic region: Secondary | ICD-10-CM | POA: Diagnosis not present

## 2024-07-07 DIAGNOSIS — M9903 Segmental and somatic dysfunction of lumbar region: Secondary | ICD-10-CM | POA: Diagnosis not present

## 2024-07-07 DIAGNOSIS — M9905 Segmental and somatic dysfunction of pelvic region: Secondary | ICD-10-CM | POA: Diagnosis not present

## 2024-07-07 DIAGNOSIS — M9901 Segmental and somatic dysfunction of cervical region: Secondary | ICD-10-CM | POA: Diagnosis not present

## 2024-07-14 DIAGNOSIS — M9905 Segmental and somatic dysfunction of pelvic region: Secondary | ICD-10-CM | POA: Diagnosis not present

## 2024-07-14 DIAGNOSIS — M9903 Segmental and somatic dysfunction of lumbar region: Secondary | ICD-10-CM | POA: Diagnosis not present

## 2024-07-14 DIAGNOSIS — M9902 Segmental and somatic dysfunction of thoracic region: Secondary | ICD-10-CM | POA: Diagnosis not present

## 2024-07-14 DIAGNOSIS — M9901 Segmental and somatic dysfunction of cervical region: Secondary | ICD-10-CM | POA: Diagnosis not present

## 2024-07-29 DIAGNOSIS — Z1239 Encounter for other screening for malignant neoplasm of breast: Secondary | ICD-10-CM | POA: Diagnosis not present

## 2024-07-29 DIAGNOSIS — Z803 Family history of malignant neoplasm of breast: Secondary | ICD-10-CM | POA: Diagnosis not present

## 2024-08-06 DIAGNOSIS — M9902 Segmental and somatic dysfunction of thoracic region: Secondary | ICD-10-CM | POA: Diagnosis not present

## 2024-08-06 DIAGNOSIS — M9903 Segmental and somatic dysfunction of lumbar region: Secondary | ICD-10-CM | POA: Diagnosis not present

## 2024-08-06 DIAGNOSIS — M9901 Segmental and somatic dysfunction of cervical region: Secondary | ICD-10-CM | POA: Diagnosis not present

## 2024-08-06 DIAGNOSIS — M9905 Segmental and somatic dysfunction of pelvic region: Secondary | ICD-10-CM | POA: Diagnosis not present

## 2024-08-25 DIAGNOSIS — M9902 Segmental and somatic dysfunction of thoracic region: Secondary | ICD-10-CM | POA: Diagnosis not present

## 2024-08-25 DIAGNOSIS — M9903 Segmental and somatic dysfunction of lumbar region: Secondary | ICD-10-CM | POA: Diagnosis not present

## 2024-08-25 DIAGNOSIS — M9905 Segmental and somatic dysfunction of pelvic region: Secondary | ICD-10-CM | POA: Diagnosis not present

## 2024-08-25 DIAGNOSIS — M9901 Segmental and somatic dysfunction of cervical region: Secondary | ICD-10-CM | POA: Diagnosis not present

## 2024-09-08 ENCOUNTER — Ambulatory Visit (INDEPENDENT_AMBULATORY_CARE_PROVIDER_SITE_OTHER): Payer: BC Managed Care – PPO | Admitting: Primary Care

## 2024-09-08 ENCOUNTER — Encounter: Payer: Self-pay | Admitting: Primary Care

## 2024-09-08 VITALS — BP 120/70 | HR 97 | Temp 98.8°F | Ht 65.0 in | Wt 198.5 lb

## 2024-09-08 DIAGNOSIS — F411 Generalized anxiety disorder: Secondary | ICD-10-CM

## 2024-09-08 DIAGNOSIS — E785 Hyperlipidemia, unspecified: Secondary | ICD-10-CM

## 2024-09-08 DIAGNOSIS — Z0000 Encounter for general adult medical examination without abnormal findings: Secondary | ICD-10-CM | POA: Diagnosis not present

## 2024-09-08 DIAGNOSIS — Z23 Encounter for immunization: Secondary | ICD-10-CM

## 2024-09-08 DIAGNOSIS — Z803 Family history of malignant neoplasm of breast: Secondary | ICD-10-CM

## 2024-09-08 NOTE — Addendum Note (Signed)
 Addended by: JACKOLYN PLANAS on: 09/08/2024 09:37 AM   Modules accepted: Orders

## 2024-09-08 NOTE — Assessment & Plan Note (Addendum)
 First shingrix  vaccine completed today.  Pap smear UTD. Mammogram UTD. Colonoscopy UTD, due 2029  Discussed the importance of a healthy diet and regular exercise in order for weight loss, and to reduce the risk of further co-morbidity.  Exam stable. She will send us  labs from her employer.  Follow up in 1 year for repeat physical.

## 2024-09-08 NOTE — Patient Instructions (Signed)
 Schedule a nurse visit to complete your second shingles vaccine in July.  It was a pleasure to see you today!

## 2024-09-08 NOTE — Assessment & Plan Note (Signed)
 Mammogram and MRI UTD.

## 2024-09-08 NOTE — Assessment & Plan Note (Signed)
 Commended her on regular exercise. She will send her recent lab results from employer.

## 2024-09-08 NOTE — Progress Notes (Signed)
 "  Subjective:    Patient ID: Karina Barnett, female    DOB: 1965/10/12, 59 y.o.   MRN: [id number]  Karina Barnett is a very pleasant 59 y.o. female who presents today for complete physical and follow up of chronic conditions.  Immunizations: -Tetanus: Completed in 2023 -Influenza: Completed this season  -Shingles: Never completed    Diet: Fair diet. Working to [employer]. Exercise: Regular exercise.  Eye exam: Completes annually  Dental exam: Completes semi-annually    Pap Smear: Completed in 2024 per GYN Mammogram: Completed in 2025 along with MRI  Colonoscopy: Completed in 2024 per [facility], due 2029  BP Readings from Last 3 Encounters:  09/08/24 120/70  01/22/24 (!) 146/82  01/15/24 109/69      Review of Systems  Constitutional:  Negative for unexpected weight change.  HENT:  Negative for rhinorrhea.   Respiratory:  Negative for cough and shortness of breath.   Cardiovascular:  Negative for chest pain.  Gastrointestinal:  Negative for constipation and diarrhea.  Genitourinary:  Negative for difficulty urinating.  Musculoskeletal:  Negative for arthralgias and myalgias.  Skin:  Negative for rash.  Allergic/Immunologic: Negative for environmental allergies.  Neurological:  Negative for dizziness and headaches.  Psychiatric/Behavioral:  The patient is nervous/anxious.          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  Other Topics Concern   Not on file  Social History Narrative   Not on file   Social Drivers of Health   Tobacco Use: Low Risk (09/08/2024)   Patient History    Smoking Tobacco Use: Never    Smokeless Tobacco Use: Never    Passive Exposure: Not on file  Financial Resource Strain: Low Risk (09/02/2023)   Overall Financial Resource Strain (CARDIA)    Difficulty of Paying Living Expenses: Not very hard  Food Insecurity: No Food Insecurity (01/19/2024)   Hunger Vital Sign    Worried About Running Out of Food in the Last Year: Never true    Ran Out of Food in the Last Year: Never true  Transportation Needs: No Transportation Needs (01/19/2024)   PRAPARE - Administrator, Civil Service (Medical): No    Lack of Transportation (Non-Medical): No  Physical Activity: Unknown (09/02/2023)   Exercise Vital Sign    Days of Exercise per Week: 0 days    Minutes of Exercise per Session: Not on file   Stress: Stress Concern Present (09/02/2023)   [employer] of Occupational Health - Occupational Stress Questionnaire    Feeling of Stress : To some extent  Social Connections: Socially Integrated (01/19/2024)   Social Connection and Isolation Panel    Frequency of Communication with Friends and Family: More than three times a week    Frequency of Social Gatherings with Friends and Family: More than three times a week    Attends Religious Services: More than 4 times per year    Active Member of [employer] or Organizations: Yes    Attends Banker Meetings: More than 4 times per year    Marital Status: Married  Catering Manager Violence: Not At Risk (01/19/2024)   Humiliation, Afraid, Rape, and Kick questionnaire    Fear of Current or Ex-Partner: No    Emotionally Abused: No    Physically Abused: No    Sexually Abused: No  Depression (PHQ2-9): Low Risk (09/04/2023)   Depression (PHQ2-9)    PHQ-2 Score: 0  Alcohol Screen: Low Risk (09/02/2023)   Alcohol Screen    Last Alcohol Screening Score (AUDIT): 1  Housing: Low Risk (01/19/2024)   Housing Stability Vital Sign    Unable to Pay for Housing in the Last Year: No    Number of Times Moved in the Last Year: 1    Homeless in the Last Year: No  Utilities: Not At Risk (01/19/2024)   AHC Utilities    Threatened with loss of utilities: No  Health Literacy: Not on file    Past Surgical History:  Procedure Laterality Date   ABDOMINAL HYSTERECTOMY  07/2018   ANTERIOR AND POSTERIOR REPAIR WITH SACROSPINOUS FIXATION N/A 08/14/2018   Procedure: ANTERIOR AND POSTERIOR REPAIR WITH SACROSPINOUS FIXATION;  Surgeon: Leva Rush, MD;  Location: [facility];  Service: Gynecology;  Laterality: N/A;   BLADDER SUSPENSION N/A 08/14/2018   Procedure: transobturator sling;  Surgeon: Leva Rush, MD;  Location: [facility];  Service: Gynecology;  Laterality: N/A;   CHOLECYSTECTOMY N/A 01/14/2024   Procedure:  LAPAROSCOPIC CHOLECYSTECTOMY;  Surgeon: Ebbie Cough, MD;  Location: [facility];  Service: General;  Laterality: N/A;  ICG DYE LATERALITY: NA GENERAL & TAP BLOCK   COLONOSCOPY     CYSTOSCOPY N/A 08/14/2018   Procedure: CYSTOSCOPY;  Surgeon: Leva Rush, MD;  Location: [facility];  Service: Gynecology;  Laterality: N/A;   LAPAROSCOPIC VAGINAL HYSTERECTOMY WITH SALPINGO OOPHORECTOMY Bilateral 08/14/2018   Procedure: LAPAROSCOPIC ASSISTED VAGINAL HYSTERECTOMY WITH SALPINGO OOPHORECTOMY;  Surgeon: Leva Rush, MD;  Location: [facility] [place];  Service: Gynecology;  Laterality: Bilateral;  need bed    Family History  Problem Relation Age of Onset   Hypertension Mother    Irritable bowel syndrome Mother    Other Mother        hx of hysterectomy in her late 77s-early 45s for prolapsed uterus   Breast cancer Mother  Cancer Mother    Varicose Veins Mother    CAD Father    Heart disease Father    Breast cancer Sister 57       DCIS   Other Sister        dx benign grape-sized tumor of abdomen   Arthritis Maternal Grandmother    Alzheimer's disease Maternal Grandmother        d. 80y   Hypertension Maternal Grandfather    Heart disease Maternal Grandfather    Heart Problems Maternal Grandfather        open heart surgery   Stroke Maternal Grandfather        d. 36y   Skin cancer Maternal Grandfather        hx of skin burn; unspecified type   Alzheimer's disease Paternal Grandmother    Diabetes Paternal Grandmother        borderline   Lung cancer Paternal Grandfather        d. 2; tumor of bronchial arch; hx of chemical inhalation - worked in marine scientist   Breast cancer Cousin 40       paternal 1st cousin    Pancreatic cancer Cousin 85       paternal 1st cousin d. 83y; smoker   Other Other        icthyosis    Allergies[1]  Medications Ordered Prior to Encounter[2]  BP 120/70   Pulse 97   Temp 98.8 F (37.1 C) (Oral)   Ht 5' 5 (1.651 m)   Wt 198 lb  8 oz (90 kg)   LMP 11/16/2012   SpO2 98%   BMI 33.03 kg/m  Objective:   Physical Exam HENT:     Right Ear: Tympanic membrane and ear canal normal.     Left Ear: Tympanic membrane and ear canal normal.  Eyes:     Pupils: Pupils are equal, round, and reactive to light.  Cardiovascular:     Rate and Rhythm: Normal rate and regular rhythm.  Pulmonary:     Effort: Pulmonary effort is normal.     Breath sounds: Normal breath sounds.  Abdominal:     General: Bowel sounds are normal.     Palpations: Abdomen is soft.     Tenderness: There is no abdominal tenderness.  Musculoskeletal:        General: Normal range of motion.     Cervical back: Neck supple.  Skin:    General: Skin is warm and dry.  Neurological:     Mental Status: She is alert and oriented to person, place, and time.     Cranial Nerves: No cranial nerve deficit.     Deep Tendon Reflexes:     Reflex Scores:      Patellar reflexes are 2+ on the right side and 2+ on the left side. Psychiatric:        Mood and Affect: Mood normal.     Physical Exam        Assessment & Plan:  Preventative health care Assessment & Plan: First shingrix  vaccine completed today.  Pap smear UTD. Mammogram UTD. Colonoscopy UTD, due 2029  Discussed the importance of a healthy diet and regular exercise in order for weight loss, and to reduce the risk of further co-morbidity.  Exam stable. She will send us  labs from her employer.  Follow up in 1 year for repeat physical.    GAD (generalized anxiety disorder) Assessment & Plan: Stable.  Manages on her own.  No concerns today.  Family history of breast cancer in first degree relative Assessment & Plan: Mammogram and MRI UTD.    Hyperlipidemia, unspecified hyperlipidemia type Assessment & Plan: Commended her on regular exercise. She will send her recent lab results from employer.     Assessment and Plan Assessment & Plan         Comer MARLA Gaskins,  NP       [1]  Allergies Allergen Reactions   Flexeril [Cyclobenzaprine Hcl] Other (See Comments)    Causes severe joint pain   Penicillins Rash  [2]  Current Outpatient Medications on File Prior to Visit  Medication Sig Dispense Refill   Biotin 5000 MCG CHEW Chew by mouth daily.     MAGNESIUM GLUCONATE PO Take by mouth daily.     Multiple Vitamins-Minerals (MULTIVITAMIN WITH MINERALS) tablet Take 1 tablet by mouth daily.     Omega-3 Fatty Acids (OMEGA 3 PO) Take by mouth daily.     Probiotic Product (PROBIOTIC PO) Take 2 capsules by mouth daily.     No current facility-administered medications on file prior to visit.   "

## 2024-09-08 NOTE — Assessment & Plan Note (Signed)
 Stable.  Manages on her own.  No concerns today.

## 2025-02-16 ENCOUNTER — Ambulatory Visit
